# Patient Record
Sex: Female | Born: 2005 | Race: White | Hispanic: No | Marital: Single | Smoking: Never smoker
Health system: Southern US, Community
[De-identification: ages and names within clinical notes are randomized; demographics above are authoritative.]

## PROBLEM LIST (undated history)

## (undated) DIAGNOSIS — T7840XA Allergy, unspecified, initial encounter: Secondary | ICD-10-CM

## (undated) DIAGNOSIS — R630 Anorexia: Secondary | ICD-10-CM

## (undated) DIAGNOSIS — Z9889 Other specified postprocedural states: Secondary | ICD-10-CM

## (undated) DIAGNOSIS — Q211 Atrial septal defect: Secondary | ICD-10-CM

## (undated) DIAGNOSIS — R000 Tachycardia, unspecified: Secondary | ICD-10-CM

## (undated) DIAGNOSIS — T4145XA Adverse effect of unspecified anesthetic, initial encounter: Secondary | ICD-10-CM

## (undated) DIAGNOSIS — A7740 Ehrlichiosis, unspecified: Secondary | ICD-10-CM

## (undated) DIAGNOSIS — T8859XA Other complications of anesthesia, initial encounter: Secondary | ICD-10-CM

## (undated) DIAGNOSIS — J0301 Acute recurrent streptococcal tonsillitis: Secondary | ICD-10-CM

## (undated) DIAGNOSIS — R112 Nausea with vomiting, unspecified: Secondary | ICD-10-CM

## (undated) HISTORY — PX: CHOLECYSTECTOMY: SHX55

## (undated) HISTORY — DX: Ehrlichiosis, unspecified: A77.40

## (undated) HISTORY — DX: Atrial septal defect: Q21.1

## (undated) HISTORY — DX: Allergy, unspecified, initial encounter: T78.40XA

---
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
## 2023-01-21 DIAGNOSIS — N390 Urinary tract infection, site not specified: Secondary | ICD-10-CM | POA: Diagnosis not present

## 2023-01-23 DIAGNOSIS — L299 Pruritus, unspecified: Secondary | ICD-10-CM | POA: Diagnosis not present

## 2023-01-25 ENCOUNTER — Ambulatory Visit (INDEPENDENT_AMBULATORY_CARE_PROVIDER_SITE_OTHER): Payer: Medicaid Other | Admitting: Family

## 2023-01-25 ENCOUNTER — Encounter: Payer: Self-pay | Admitting: Family

## 2023-01-25 VITALS — BP 126/73 | HR 88 | Temp 98.5°F | Ht 72.0 in

## 2023-01-25 DIAGNOSIS — L299 Pruritus, unspecified: Secondary | ICD-10-CM | POA: Diagnosis not present

## 2023-01-25 MED ORDER — CETIRIZINE HCL 10 MG PO TABS
10.0000 mg | ORAL_TABLET | Freq: Every day | ORAL | 1 refills | Status: AC
Start: 2023-01-25 — End: ?

## 2023-01-25 NOTE — Progress Notes (Signed)
   Subjective:    Patient ID: Mary Russo, female    DOB: 30-Dec-2005, 17 y.o.   MRN: [id number]  Chief Complaint  Patient presents with   ear itching     Went to urgent care they told her it had scars    PT presents to the office today with left ear itching and pain that started last week. Went to the Urgent Care and told she had scaring in her ear.  Otalgia  There is pain in the left ear. This is a new problem. The current episode started in the past 7 days. There has been no fever. The pain is mild.      Review of Systems  HENT:  Positive for ear pain.   All other systems reviewed and are negative.      Objective:   Physical Exam Vitals reviewed.  Constitutional:      General: She is not in acute distress.    Appearance: She is well-developed.  HENT:     Head: Normocephalic and atraumatic.  Eyes:     Pupils: Pupils are equal, round, and reactive to light.  Neck:     Thyroid: No thyromegaly.  Cardiovascular:     Rate and Rhythm: Normal rate and regular rhythm.     Heart sounds: Normal heart sounds. No murmur heard. Pulmonary:     Effort: Pulmonary effort is normal. No respiratory distress.     Breath sounds: Normal breath sounds. No wheezing.  Abdominal:     General: Bowel sounds are normal. There is no distension.     Palpations: Abdomen is soft.     Tenderness: There is no abdominal tenderness.  Musculoskeletal:        General: No tenderness. Normal range of motion.     Cervical back: Normal range of motion and neck supple.  Skin:    General: Skin is warm and dry.  Neurological:     Mental Status: She is alert and oriented to person, place, and time.     Cranial Nerves: No cranial nerve deficit.     Deep Tendon Reflexes: Reflexes are normal and symmetric.  Psychiatric:        Behavior: Behavior normal.        Thought Content: Thought content normal.        Judgment: Judgment normal.          BP 126/73   Pulse 88   Temp 98.5 F (36.9 C) (Temporal)    Ht 6' (1.829 m)   SpO2 98%   Breastfeeding No   BMI 21.97 kg/m   Assessment & Plan:   GLADYSE DUREE comes in today with chief complaint of ear itching  (Went to urgent care they told her it had scars )   Diagnosis and orders addressed:  1. Ear itch TM normal  Canal not erythemas Start zyrtec  Keep clean and dry Follow up if symptoms worsen or do not improve  - cetirizine (ZYRTEC ALLERGY) 10 MG tablet; Take 1 tablet (10 mg total) by mouth daily.  Dispense: 90 tablet; Refill: 1  Jannifer Rodney, FNP

## 2023-03-24 IMAGING — US US PELVIS COMPLETE
1 series · 14 of 25 positions shown · non-contrast
Comparison: None.

CLINICAL DATA: Pelvic pain

EXAM:
TRANSABDOMINAL ULTRASOUND OF PELVIS
DOPPLER ULTRASOUND OF OVARIES
TECHNIQUE: Transabdominal ultrasound examination of the pelvis was performed
including evaluation of the uterus, ovaries, adnexal regions, and
pelvic cul-de-sac.
Color and duplex Doppler ultrasound was utilized to evaluate blood
flow to the ovaries.

[Series 1: us pelvis (transabdominal only) · 14 of 47 slices shown]
[im 1/47]
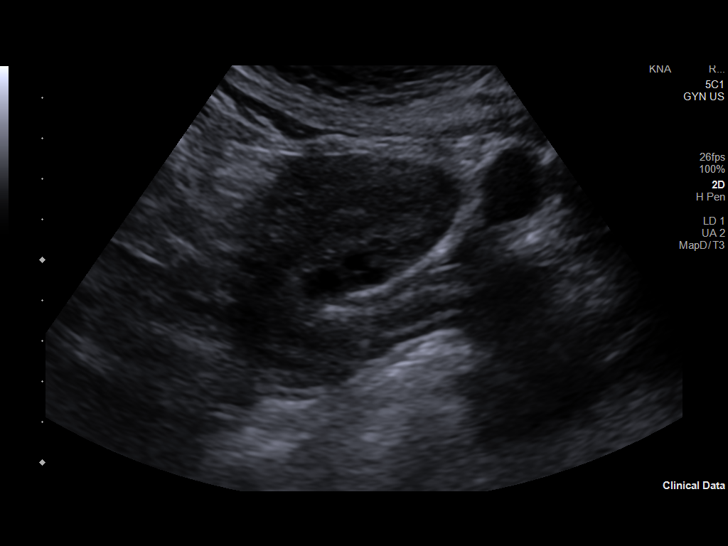
[im 4/47]
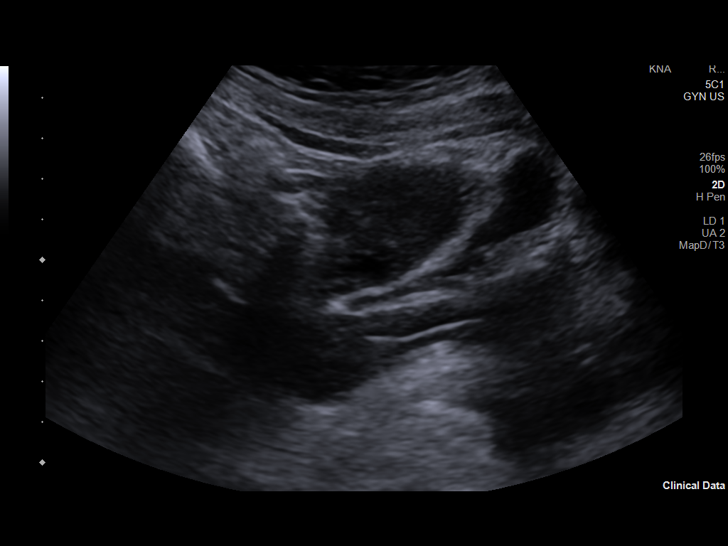
[im 8/47]
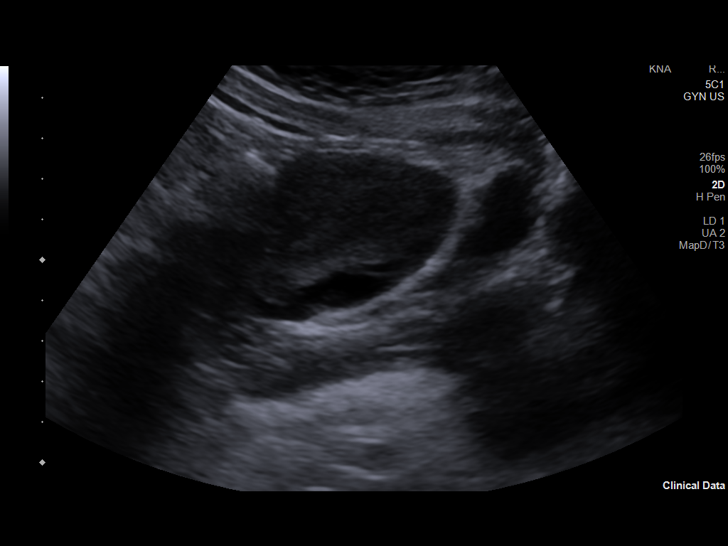
[im 12/47]
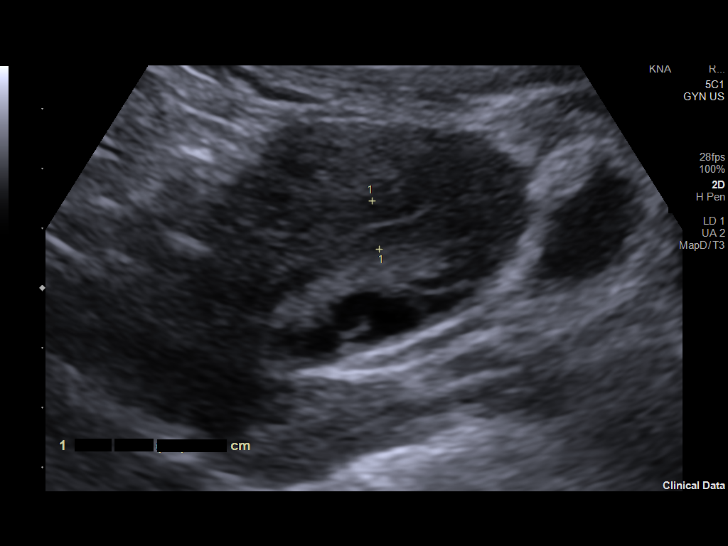
[im 16/47]
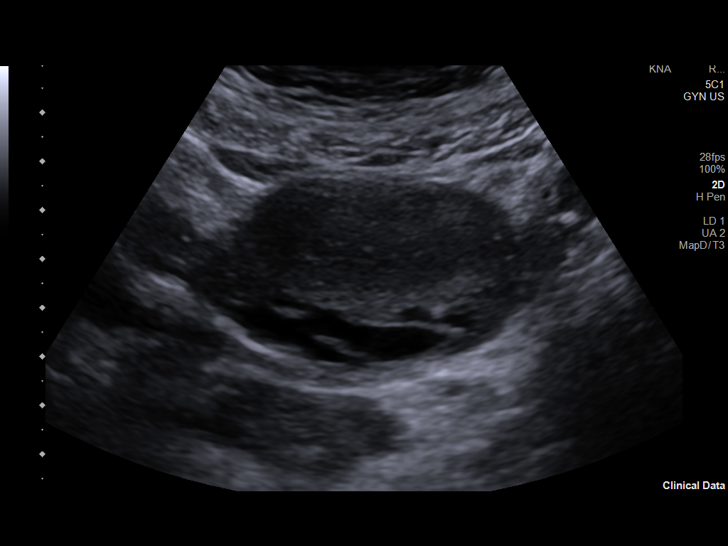
[im 18/47]
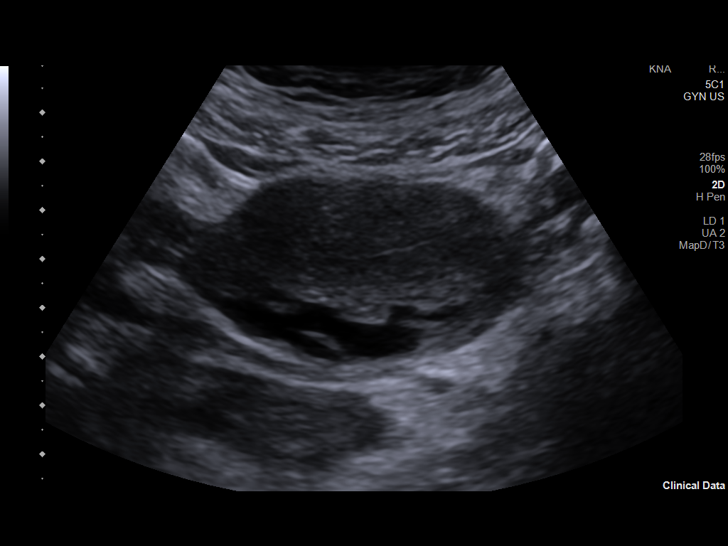
[im 22/47]
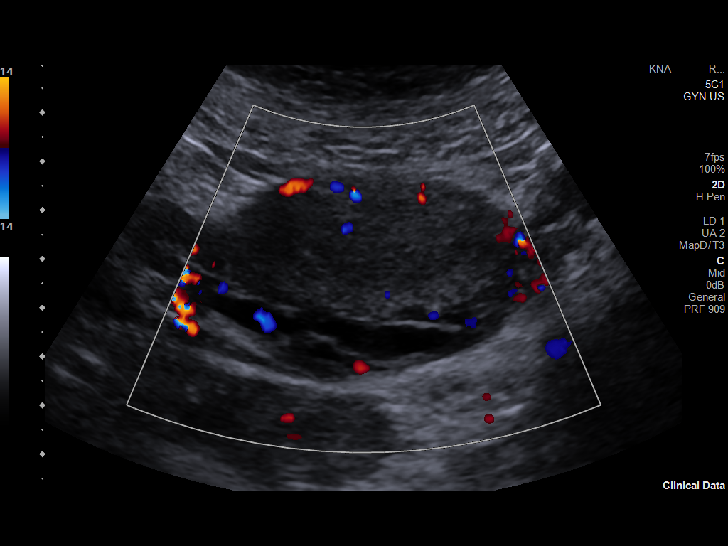
[im 25/47]
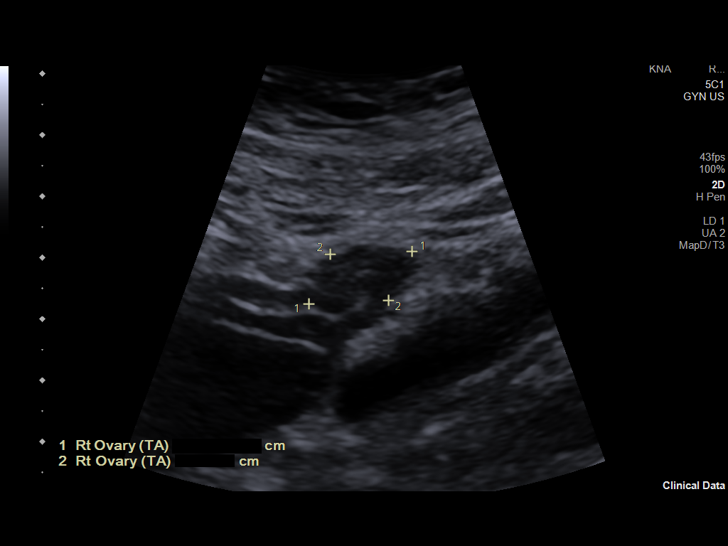
[im 29/47]
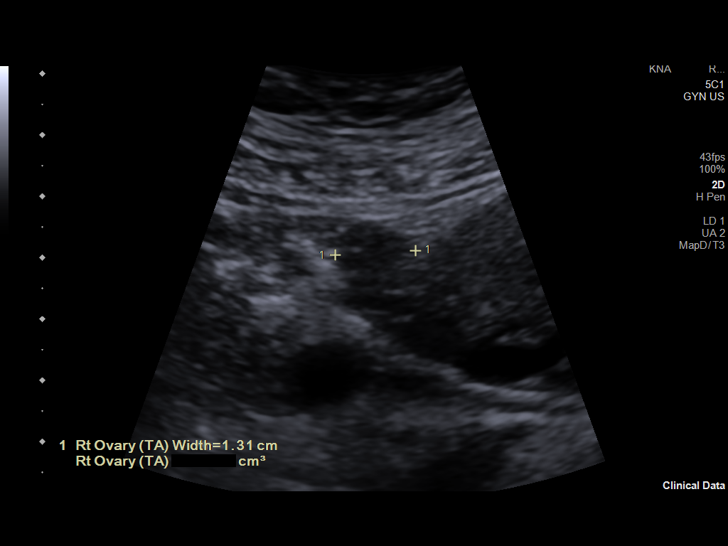
[im 31/47]
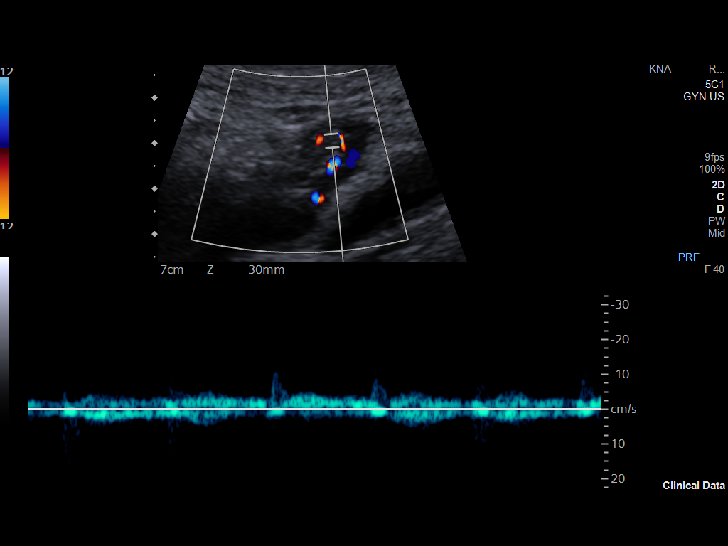
[im 35/47]
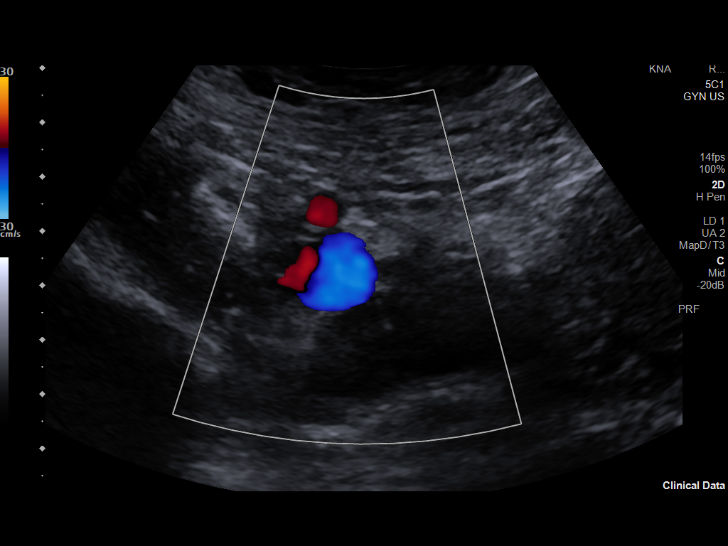
[im 39/47]
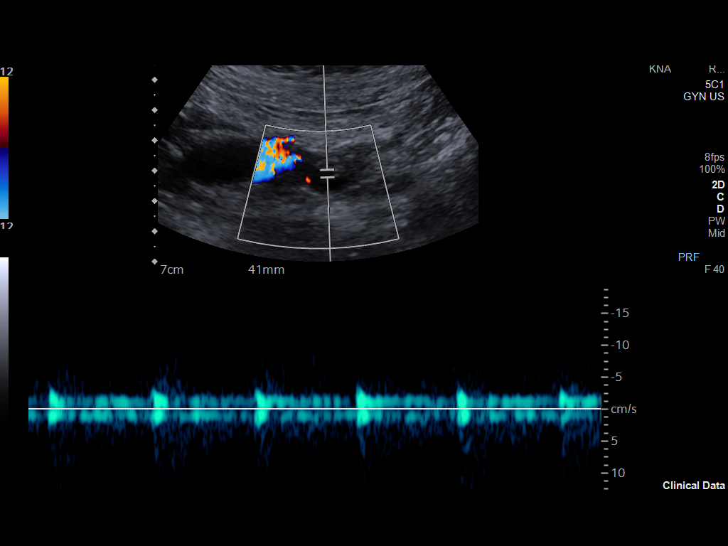
[im 43/47]
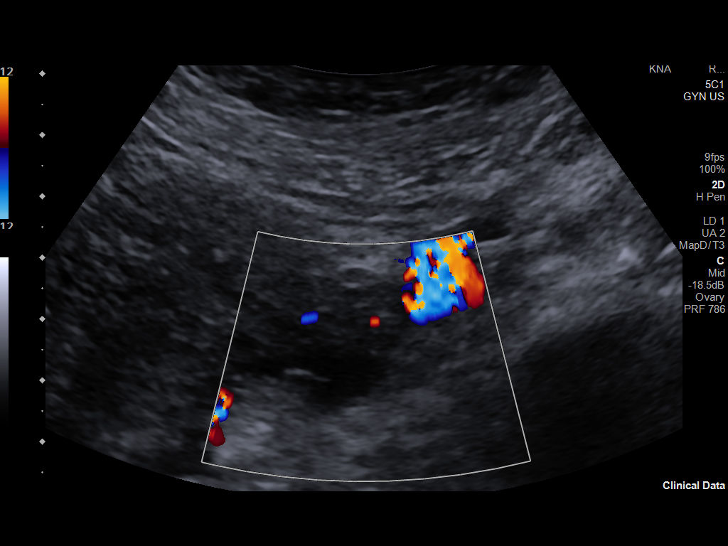
[im 47/47]
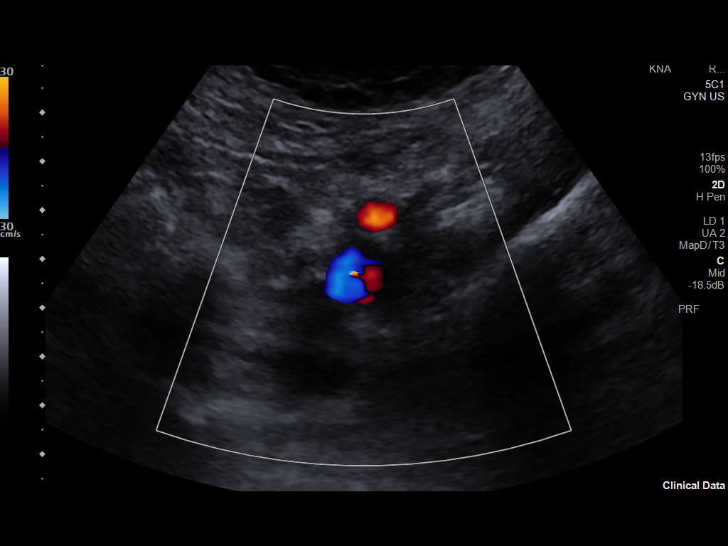

[14 of 25 positions shown; findings below may reference images not displayed]

FINDINGS: Uterus

Measurements: 4.8 x 4.2 x 6.1 cm = volume: 90 mL. No fibroids or
other mass visualized. Prominent myometrial veins noted.

Endometrium

Thickness: 8.  No focal abnormality visualized.

Right ovary

Measurements: 1.9 x 1.2 x 1.3 cm = volume: 1.6 mL. Normal
appearance/no adnexal mass.

Left ovary

Measurements: 2 x 1.5 x 1.9 cm = volume: 3.1 mL. Normal
appearance/no adnexal mass.

Pulsed Doppler evaluation demonstrates normal low-resistance
arterial and venous waveforms in both ovaries.

Other: None.
IMPRESSION: No acute process or suspicious mass identified in the pelvis.

## 2023-03-24 IMAGING — CT CT RENAL STONE PROTOCOL
2 of 4 series · 16 of 46 positions shown, 18 images · non-contrast
Comparison: CT Abdomen and Pelvis 02/16/2017.

CLINICAL DATA: 15-year-old female with right lower quadrant
abdominal pain progressive for 2 days. Recently diagnosed with UTI.

EXAM:
CT ABDOMEN AND PELVIS WITHOUT CONTRAST
TECHNIQUE: Multidetector CT imaging of the abdomen and pelvis was performed
following the standard protocol without IV contrast.

[Series 2: axial st · axial · 0.68mm/px · z∈[+658,+1058]mm · 13 of 89 slices shown, 15 images]
[im 5/89  soft-tissue]
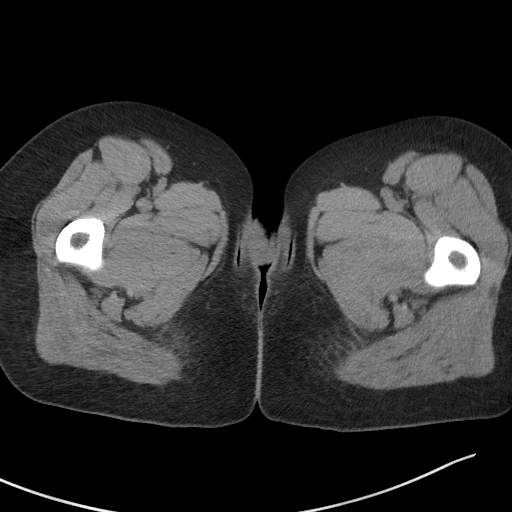
[im 5/89  bone]
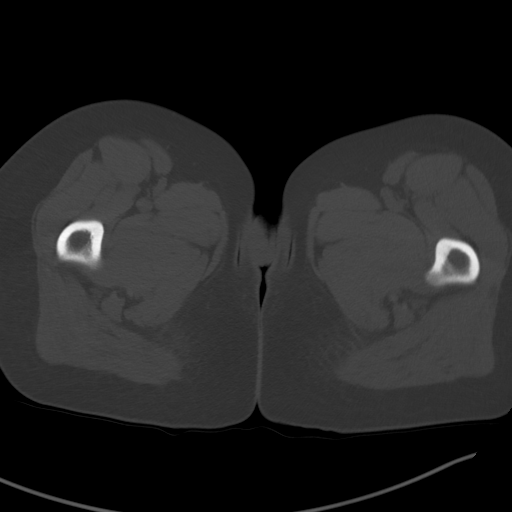
[im 13/89  soft-tissue]
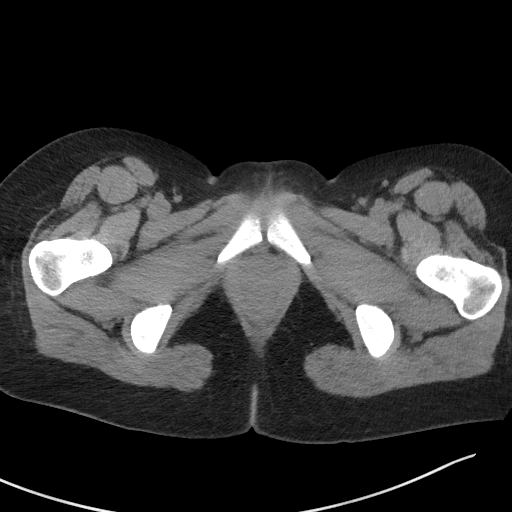
[im 21/89  soft-tissue]
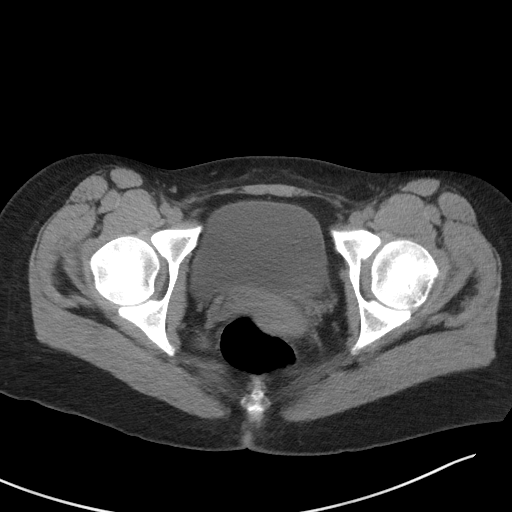
[im 25/89  soft-tissue]
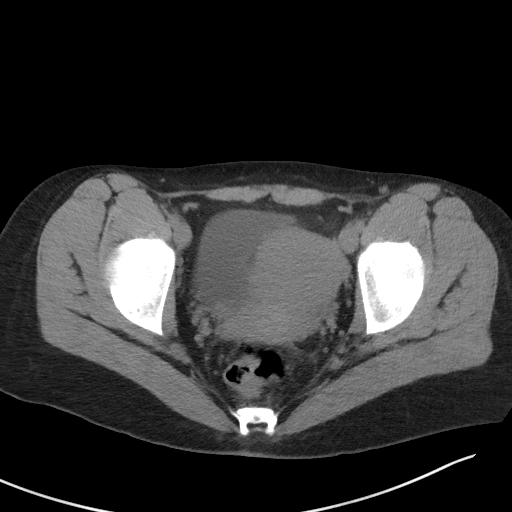
[im 33/89  soft-tissue]
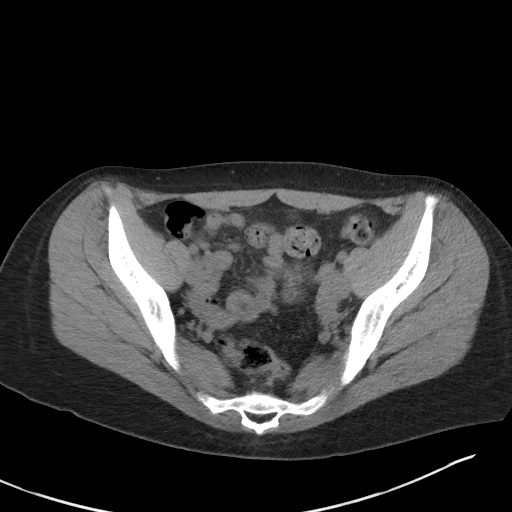
[im 37/89  soft-tissue]
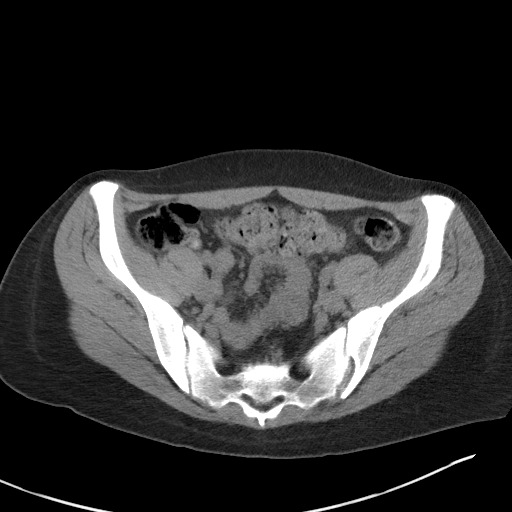
[im 45/89  soft-tissue]
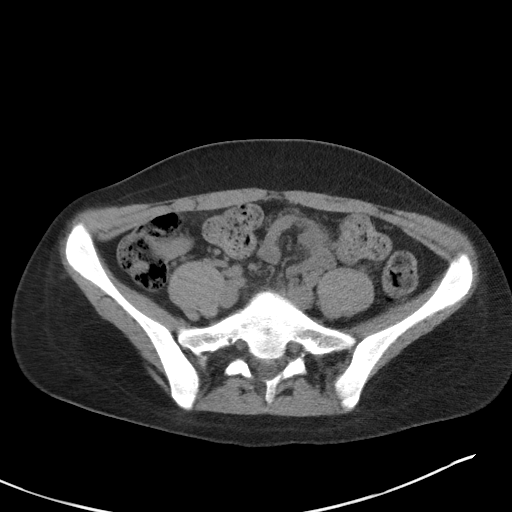
[im 53/89  soft-tissue]
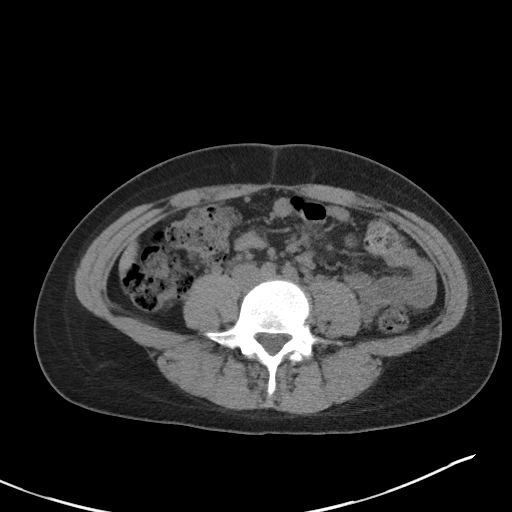
[im 57/89  soft-tissue]
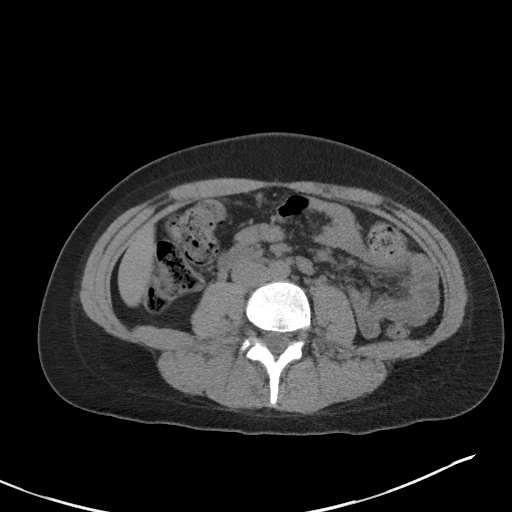
[im 57/89  bone]
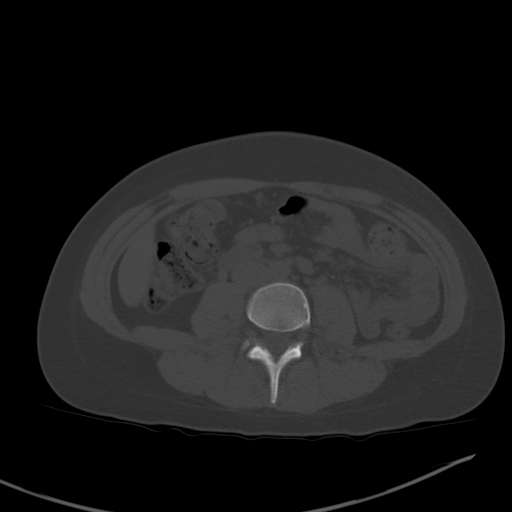
[im 65/89  soft-tissue]
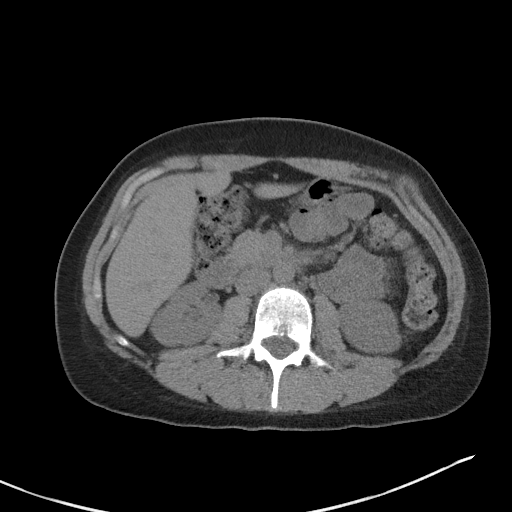
[im 69/89  soft-tissue]
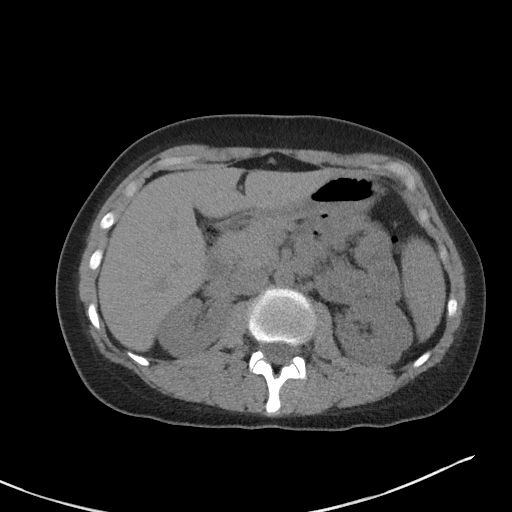
[im 77/89  soft-tissue]
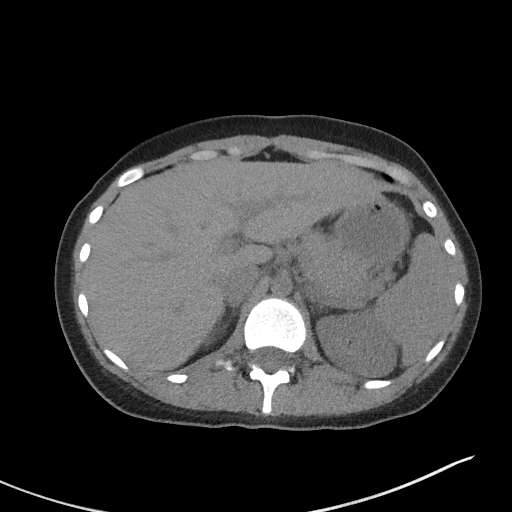
[im 85/89  soft-tissue]
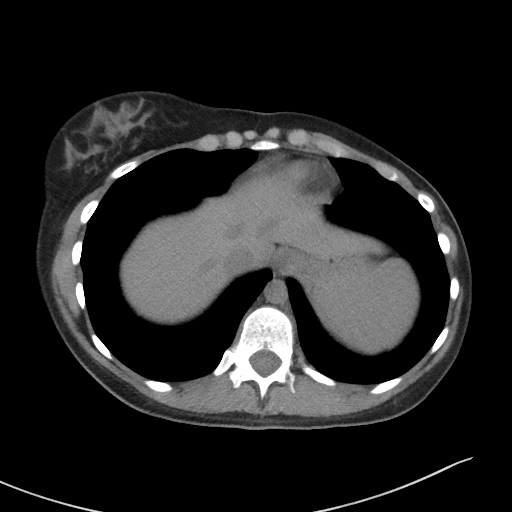

[Series 5: coronal st · coronal · 0.69mm/px · 3 of 90 slices shown]
[im 40/90  soft-tissue]
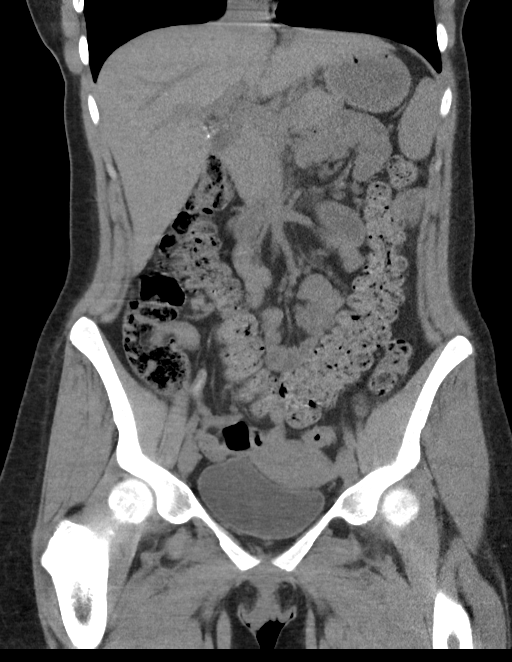
[im 50/90  soft-tissue]
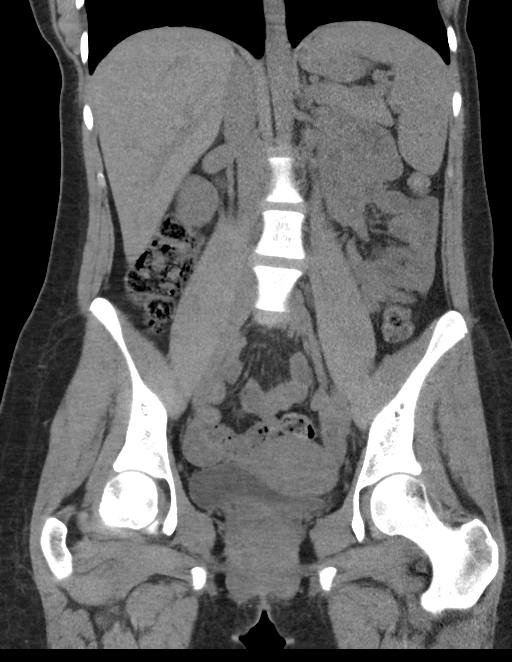
[im 60/90  soft-tissue]
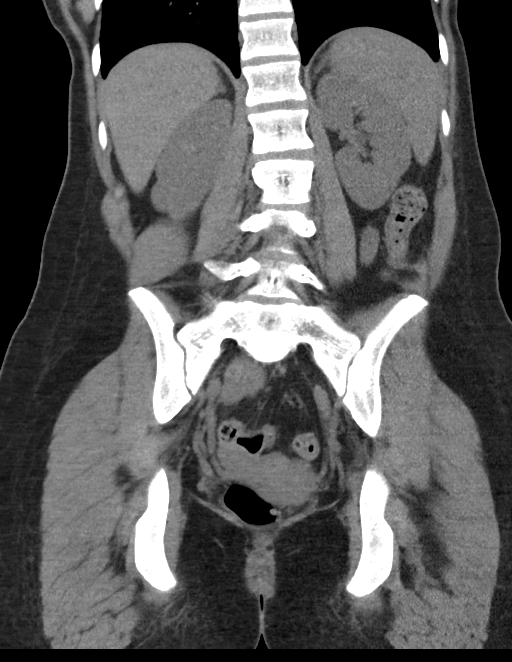

[16 of 46 positions shown; findings below may reference images not displayed]

FINDINGS: Lower chest: Negative.

Hepatobiliary: Chronically absent gallbladder. Negative noncontrast
liver.

Pancreas: Negative.

Spleen: Negative.

Adrenals/Urinary Tract: Normal adrenal glands. Noncontrast kidneys
appears symmetric and within normal limits. No nephrolithiasis. No
pararenal inflammation. Punctate bilateral pelvic phleboliths on
series 2, image 70, but no calcification identified along the course
of either ureter. Unremarkable bladder.

Stomach/Bowel: Fairly redundant large bowel with retained stool. No
pericecal inflammation.

The appendix is suboptimally visualized. Nondilated appendix is
suspected on series 2, images 57 and 58. Ileocecal valve is on
series 2, image 45. And the appendix probably arises from the cecum
on series 2, image 53. At the proximal appendix there is
hyperdensity within the lumen, and the structure measures up to 7 mm
(coronal image 40), slightly enlarged.

Negative terminal ileum and no dilated small bowel. Stomach and
duodenum appear negative. No free air, free fluid, or mesenteric
inflammation identified.

Vascular/Lymphatic: Normal caliber abdominal aorta. No calcified
atherosclerosis or lymphadenopathy identified.

Reproductive: Negative noncontrast appearance.

Other: No pelvic free fluid.

Musculoskeletal: Mild levoconvex lumbar scoliosis. Otherwise
negative.
IMPRESSION: 1. Questionable appendicolith, but no strong evidence of acute
appendicitis.
If the clinical course is equivocal then a repeat CT Abdomen and
Pelvis with both IV and oral contrast in 12-24 hours may be
valuable.

2. No urinary calculus or obstructive uropathy, and no other acute
or inflammatory process identified in the noncontrast abdomen or
pelvis.

## 2023-03-26 IMAGING — CT CT ABD-PELV W/ CM
2 of 4 series · 16 of 46 positions shown, 18 images · IV contrast (OMNIPAQUE)
Comparison: 04/16/2021

CLINICAL DATA: Right lower quadrant pain for 1 week

EXAM:
CT ABDOMEN AND PELVIS WITH CONTRAST
TECHNIQUE: Multidetector CT imaging of the abdomen and pelvis was performed
using the standard protocol following bolus administration of
intravenous contrast.
CONTRAST:  80mL OMNIPAQUE IOHEXOL 350 MG/ML SOLN

[Series 2: axial st · axial · 0.75mm/px · z∈[-747,-347]mm · 13 of 90 slices shown, 15 images]
[im 5/90  soft-tissue]
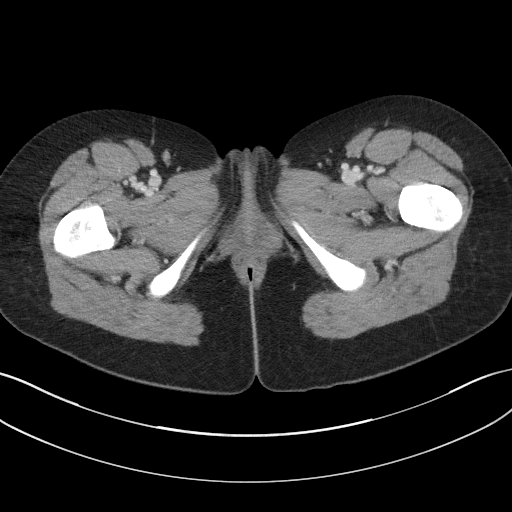
[im 5/90  bone]
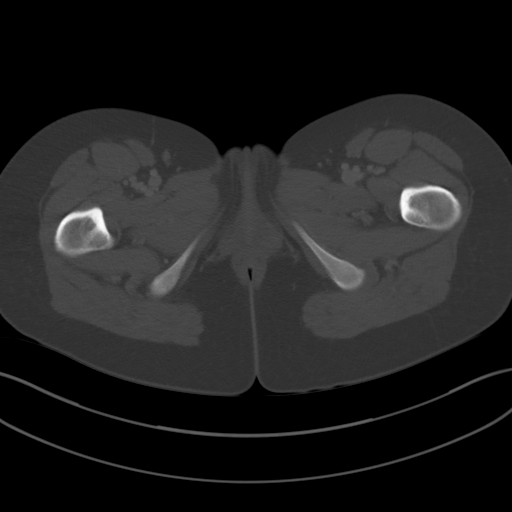
[im 13/90  soft-tissue]
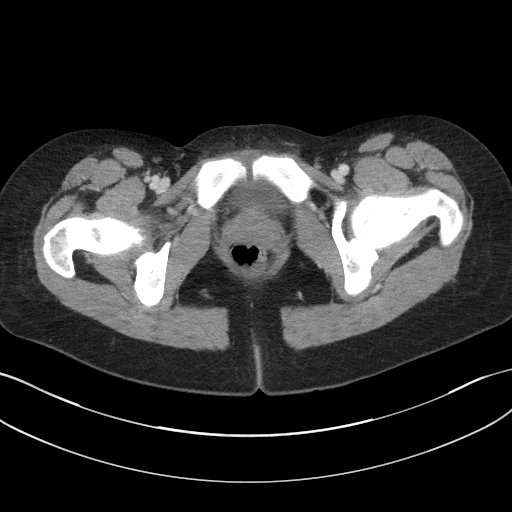
[im 21/90  soft-tissue]
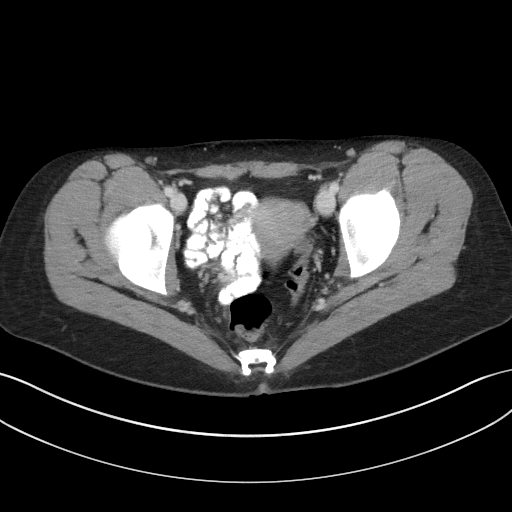
[im 25/90  soft-tissue]
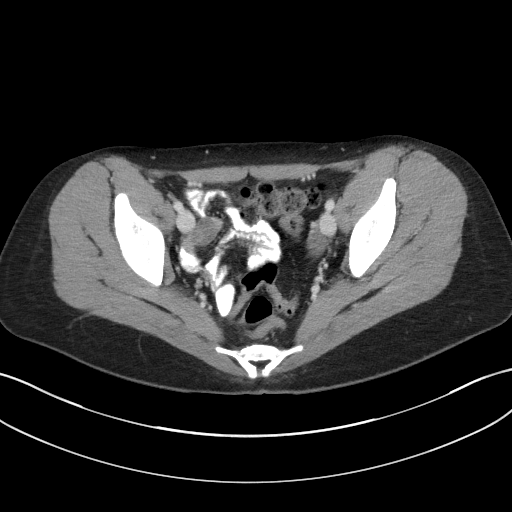
[im 33/90  soft-tissue]
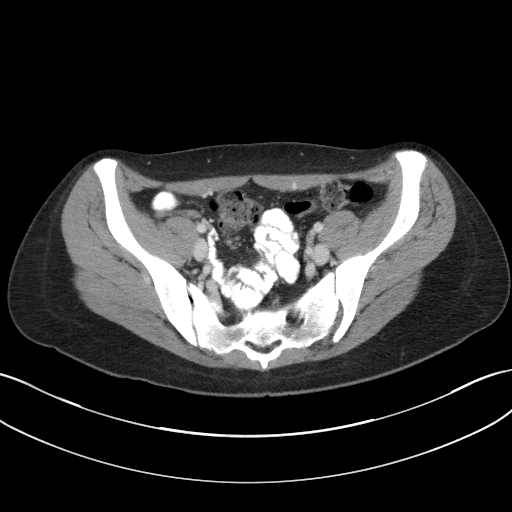
[im 37/90  soft-tissue]
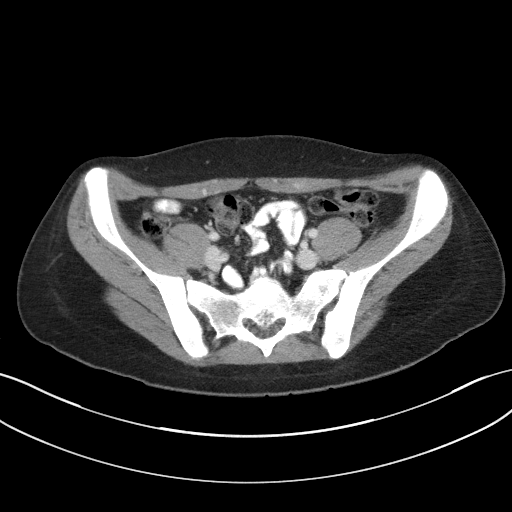
[im 45/90  soft-tissue]
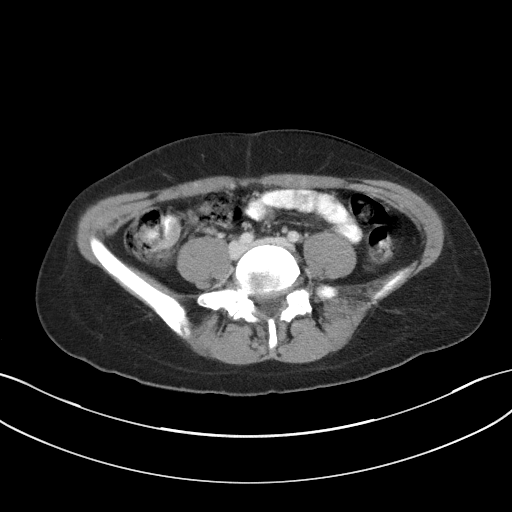
[im 53/90  soft-tissue]
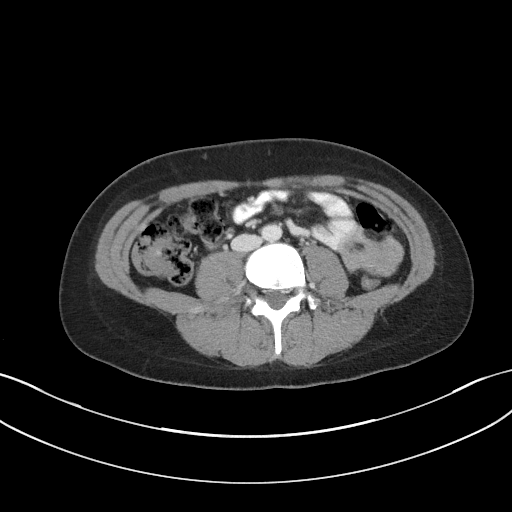
[im 57/90  soft-tissue]
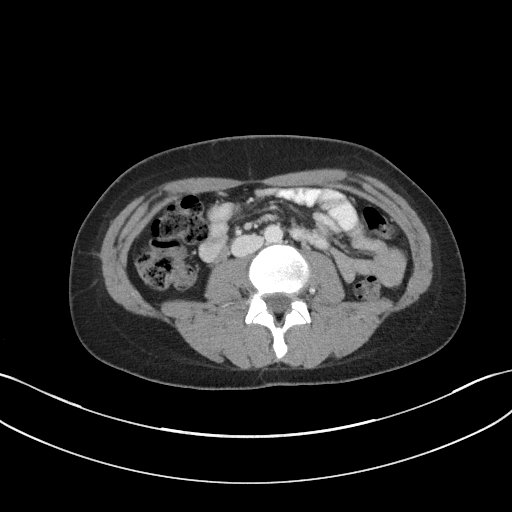
[im 57/90  bone]
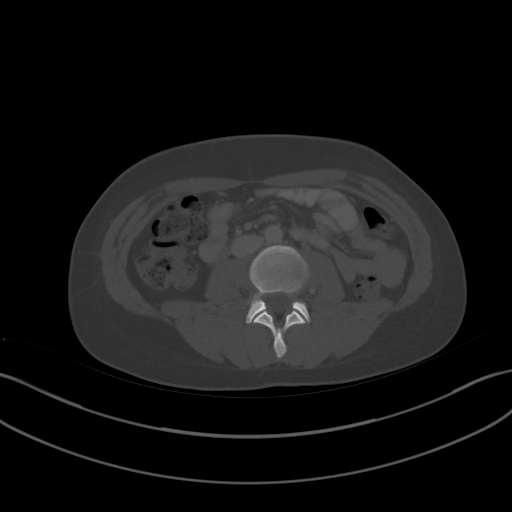
[im 65/90  soft-tissue]
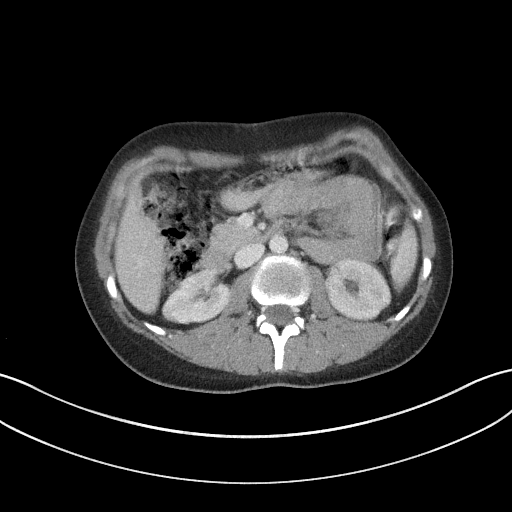
[im 69/90  soft-tissue]
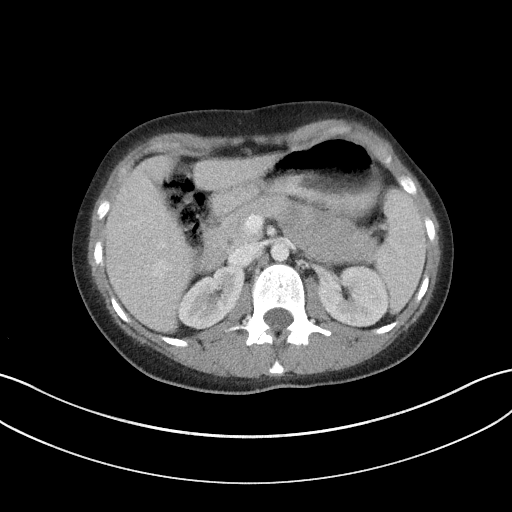
[im 77/90  soft-tissue]
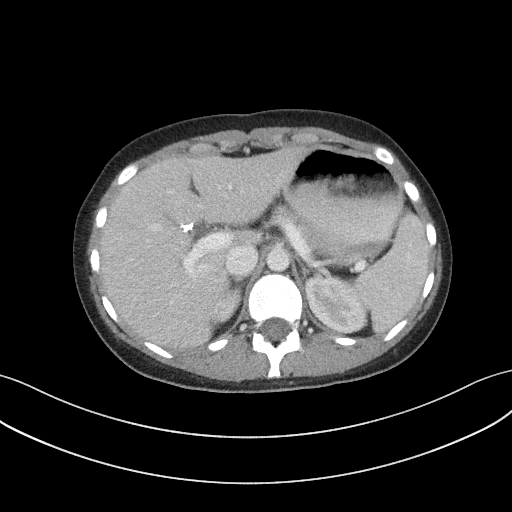
[im 85/90  soft-tissue]
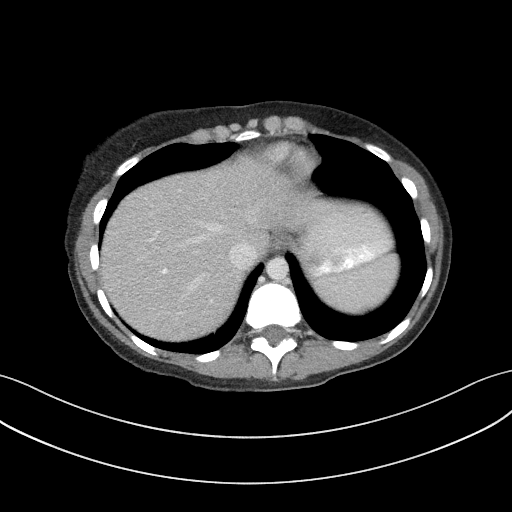

[Series 5: coronal st · coronal · 0.68mm/px · 3 of 100 slices shown]
[im 34/100  soft-tissue]
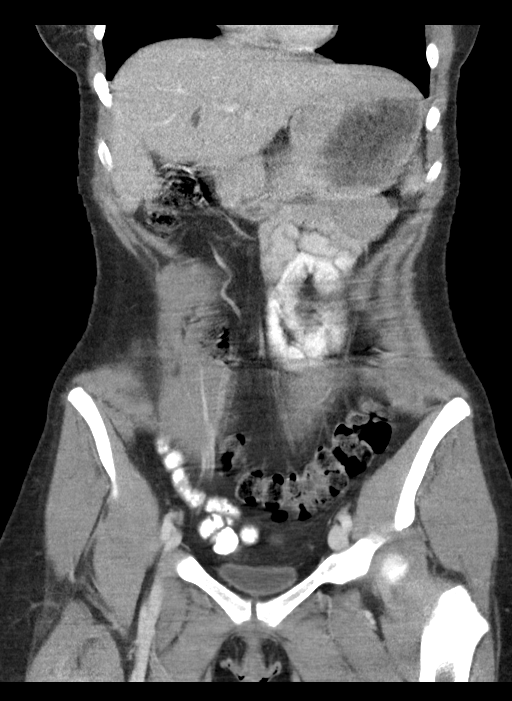
[im 45/100  soft-tissue]
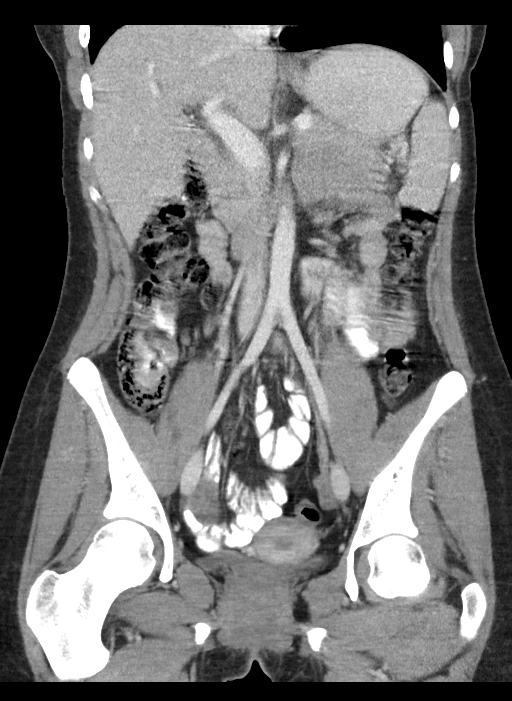
[im 56/100  soft-tissue]
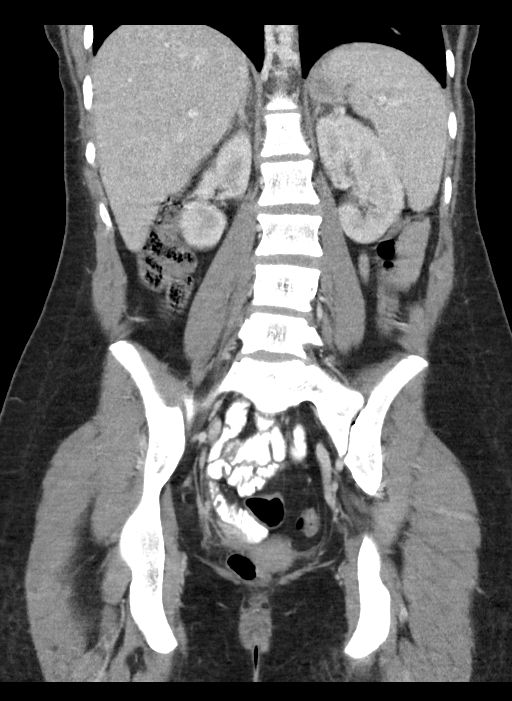

[16 of 46 positions shown; findings below may reference images not displayed]

FINDINGS: Lower chest: No acute pleural or parenchymal lung disease.

Hepatobiliary: Portions of the dome of the liver are excluded by
slice selection. The visualized portions of the liver are
unremarkable. Gallbladder is surgically absent.

Pancreas: Unremarkable. No pancreatic ductal dilatation or
surrounding inflammatory changes.

Spleen: Normal in size without focal abnormality.

Adrenals/Urinary Tract: Adrenal glands are unremarkable. Kidneys are
normal, without renal calculi, focal lesion, or hydronephrosis.
Bladder is decompressed which limits its evaluation.

Stomach/Bowel: No bowel obstruction or ileus. A normal appendix is
seen in the right lower quadrant. The high attenuation material
within the appendiceal lumen previously is no longer identified. No
appendicoliths on this exam. No bowel wall thickening or
inflammatory change.

Vascular/Lymphatic: No significant vascular findings are present.
Subcentimeter lymph nodes are seen within the right lower quadrant
mesentery, likely reactive. No enlarged abdominal or pelvic lymph
nodes.

Reproductive: Uterus and bilateral adnexa are unremarkable.

Other: No free fluid or free gas.  No abdominal wall hernia.

Musculoskeletal: No acute or destructive bony lesions. Reconstructed
images demonstrate no additional findings.
IMPRESSION: 1. Normal appendix.
2. Subcentimeter right lower quadrant mesenteric lymph nodes, which
could reflect mesenteric adenitis in a patient with right lower
quadrant pain.

## 2023-04-03 ENCOUNTER — Ambulatory Visit: Payer: 59 | Admitting: Family Medicine

## 2023-04-03 ENCOUNTER — Encounter: Payer: Self-pay | Admitting: Family Medicine

## 2023-04-03 VITALS — BP 109/72 | HR 72 | Temp 98.7°F | Ht 72.0 in | Wt 161.0 lb

## 2023-04-03 DIAGNOSIS — W57XXXA Bitten or stung by nonvenomous insect and other nonvenomous arthropods, initial encounter: Secondary | ICD-10-CM | POA: Diagnosis not present

## 2023-04-03 DIAGNOSIS — S80862A Insect bite (nonvenomous), left lower leg, initial encounter: Secondary | ICD-10-CM

## 2023-04-03 MED ORDER — DOXYCYCLINE HYCLATE 100 MG PO TABS
100.0000 mg | ORAL_TABLET | Freq: Two times a day (BID) | ORAL | 0 refills | Status: AC
Start: 2023-04-03 — End: 2023-04-13

## 2023-04-03 NOTE — Progress Notes (Signed)
Subjective:  Patient ID: Mary Russo, female    DOB: March 09, 2006, 17 y.o.   MRN: [id number]  Patient Care Team: [name], Elige Radon, MD as PCP - General (Family Medicine) Thomasene Ripple, DO as PCP - Cardiology (Cardiology)   Chief Complaint:  Insect Bite (Possible spider bite?)  HPI: Mary Russo is a 17 y.o. female presenting on 04/03/2023 for Insect Bite (Possible spider bite?) First noticed this morning to aching. Denies any redness at the area. She went into work and noticed itching at 10 am and felt spot. She noticed redness and pain at the area. Reports that it feels like a bruise with soreness and cramping. Endorses jitteriness. Does not know of a specific bite. States that she was outdoors this weekend.   Relevant past medical, surgical, family, and social history reviewed and updated as indicated.  Allergies and medications reviewed and updated. Data reviewed: Chart in Epic.   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   Tachycardia, unspecified     Past Surgical History:  Procedure Laterality Date   ANKLE RECONSTRUCTION Right 01/30/2019   Procedure: Right ankle lateral ligament reconstruction;  Surgeon: Toni Arthurs, MD;  Location: [facility];  Service: Orthopedics;  Laterality: Right;   CHOLECYSTECTOMY      Social History   Socioeconomic History   Marital status: Single    Spouse name: Not on file   Number of children: Not on file   Years of education: Not on file   Highest education level: Not on file  Occupational History   Not on file  Tobacco Use   Smoking status: Never   Smokeless tobacco: Never  Vaping Use   Vaping status: Never Used  Substance and Sexual Activity    Alcohol use: No   Drug use: No   Sexual activity: Yes  Other Topics Concern   Not on file  Social History Narrative   Not on file   Social Determinants of Health   Financial Resource Strain: Not on file  Food Insecurity: No Food Insecurity (07/07/2021)   Hunger Vital Sign    Worried About Running Out of Food in the Last Year: Never true    Ran Out of Food in the Last Year: Never true  Transportation Needs: No Transportation Needs (07/07/2021)   PRAPARE - Administrator, Civil Service (Medical): No    Lack of Transportation (Non-Medical): No  Physical Activity: Not on file  Stress: Not on file  Social Connections: Not on file  Intimate Partner Violence: Not on file    Outpatient Encounter Medications as of 04/03/2023  Medication Sig   cetirizine (ZYRTEC ALLERGY) 10 MG tablet Take 1 tablet (10 mg total) by mouth daily.   citalopram (CELEXA) 20 MG tablet Take 20 mg by mouth daily.   ibuprofen (ADVIL) 600 MG tablet Take 1 tablet (600 mg total) by mouth every 6 (six) hours as needed.   metoprolol succinate (TOPROL-XL) 25 MG 24 hr tablet Take 1 tablet (25 mg total) by mouth daily.   norethindrone-ethinyl estradiol-FE (LOESTRIN FE) 1-20 MG-MCG tablet Take 1 tablet by mouth daily.   No facility-administered encounter medications on  file as of 04/03/2023.    Allergies  Allergen Reactions   Peanut-Containing Drug Products Rash    Peanuts, Peanut  butter   Review of Systems As per HPI  Objective:  BP 109/72   Pulse 72   Temp 98.7 F (37.1 C)   Ht 6' (1.829 m)   Wt 161 lb (73 kg)   LMP 04/03/2023 (Exact Date)   SpO2 99%   BMI 21.84 kg/m    Wt Readings from Last 3 Encounters:  04/03/23 161 lb (73 kg) (88%, Z= 1.16)*  11/02/22 162 lb (73.5 kg) (89%, Z= 1.21)*  10/06/22 161 lb (73 kg) (88%, Z= 1.19)*   * Growth percentiles are based on CDC (Girls, 2-20 Years) data.   Physical Exam Constitutional:      General: She is awake. She is not in acute distress.     Appearance: Normal appearance. She is well-developed and well-groomed. She is not ill-appearing, toxic-appearing or diaphoretic.  Cardiovascular:     Rate and Rhythm: Normal rate and regular rhythm.     Pulses: Normal pulses.          Radial pulses are 2+ on the right side and 2+ on the left side.       Posterior tibial pulses are 2+ on the right side and 2+ on the left side.     Heart sounds: Normal heart sounds. No murmur heard.    No gallop.  Pulmonary:     Effort: Pulmonary effort is normal. No respiratory distress.     Breath sounds: Normal breath sounds. No stridor. No wheezing, rhonchi or rales.  Musculoskeletal:     Cervical back: Full passive range of motion without pain and neck supple.     Right lower leg: No edema.     Left lower leg: No edema.  Skin:    General: Skin is warm.     Capillary Refill: Capillary refill takes less than 2 seconds.     Coloration: Skin is pale.          Comments: Flat, erythematous circular lesion with central circular hyperpigmentation on left lower calf with surrounding area of tenderness to palpation.  34 cm circumference of Right calf  36 cm circumference of left calf   Neurological:     General: No focal deficit present.     Mental Status: She is alert, oriented to person, place, and time and easily aroused. Mental status is at baseline.     GCS: GCS eye subscore is 4. GCS verbal subscore is 5. GCS motor subscore is 6.     Motor: No weakness.  Psychiatric:        Attention and Perception: Attention and perception normal.        Mood and Affect: Mood and affect normal.        Speech: Speech normal.        Behavior: Behavior normal. Behavior is cooperative.        Thought Content: Thought content normal. Thought content does not include homicidal or suicidal ideation. Thought content does not include homicidal or suicidal plan.        Cognition and Memory: Cognition and memory normal.        Judgment: Judgment normal.    Results for  orders placed or performed in visit on 11/02/22  Anemia Profile B  Result Value Ref Range   Total Iron Binding Capacity 363 250 - 450 ug/dL   UIBC 478 295 - 621 ug/dL   Iron 68  27 - 159 ug/dL   Iron Saturation 19 15 - 55 %   Ferritin 11 (L) 15 - 77 ng/mL   Vitamin B-12 268 232 - 1,245 pg/mL   Folate 6.0 >3.0 ng/mL   WBC 6.1 3.4 - 10.8 x10E3/uL   RBC 4.32 3.77 - 5.28 x10E6/uL   Hemoglobin 11.8 11.1 - 15.9 g/dL   Hematocrit 16.1 09.6 - 46.6 %   MCV 85 79 - 97 fL   MCH 27.3 26.6 - 33.0 pg   MCHC 32.3 31.5 - 35.7 g/dL   RDW 04.5 40.9 - 81.1 %   Platelets 307 150 - 450 x10E3/uL   Neutrophils 59 Not Estab. %   Lymphs 30 Not Estab. %   Monocytes 7 Not Estab. %   Eos 3 Not Estab. %   Basos 1 Not Estab. %   Neutrophils Absolute 3.6 1.4 - 7.0 x10E3/uL   Lymphocytes Absolute 1.8 0.7 - 3.1 x10E3/uL   Monocytes Absolute 0.4 0.1 - 0.9 x10E3/uL   EOS (ABSOLUTE) 0.2 0.0 - 0.4 x10E3/uL   Basophils Absolute 0.1 0.0 - 0.2 x10E3/uL   Immature Granulocytes 0 Not Estab. %   Immature Grans (Abs) 0.0 0.0 - 0.1 x10E3/uL   Retic Ct Pct 1.0 0.6 - 2.6 %  TSH  Result Value Ref Range   TSH 3.330 0.450 - 4.500 uIU/mL  CMP14+EGFR  Result Value Ref Range   Glucose 85 70 - 99 mg/dL   BUN 8 6 - 20 mg/dL   Creatinine, Ser 9.14 0.57 - 1.00 mg/dL   eGFR 782 >95 AO/ZHY/8.65   BUN/Creatinine Ratio 10 9 - 23   Sodium 138 134 - 144 mmol/L   Potassium 4.1 3.5 - 5.2 mmol/L   Chloride 104 96 - 106 mmol/L   CO2 20 20 - 29 mmol/L   Calcium 9.8 8.7 - 10.2 mg/dL   Total Protein 7.0 6.0 - 8.5 g/dL   Albumin 4.5 4.0 - 5.0 g/dL   Globulin, Total 2.5 1.5 - 4.5 g/dL   Albumin/Globulin Ratio 1.8 1.2 - 2.2   Bilirubin Total 0.3 0.0 - 1.2 mg/dL   Alkaline Phosphatase 81 42 - 106 IU/L   AST 12 0 - 40 IU/L   ALT 12 0 - 32 IU/L  VITAMIN D 25 Hydroxy (Vit-D Deficiency, Fractures)  Result Value Ref Range   Vit D, 25-Hydroxy 33.2 30.0 - 100.0 ng/mL      11/02/2022    2:35 PM 10/06/2022    9:51 AM 10/06/2022    9:50 AM  01/27/2022    2:43 PM 07/05/2021   10:00 AM  Depression screen PHQ 2/9  Decreased Interest 0  0 0 0  Down, Depressed, Hopeless 0  0 0 0  PHQ - 2 Score 0  0 0 0  Altered sleeping 0 0  0   Tired, decreased energy 0 0  0   Change in appetite 0 0  0   Feeling bad or failure about yourself  0 0  0   Trouble concentrating 0 0  0   Moving slowly or fidgety/restless 0 0  0   Suicidal thoughts 0 0  0   PHQ-9 Score 0   0   Difficult doing work/chores Not difficult at all Not difficult at all  Not difficult at all        10/06/2022    9:50 AM 01/27/2022    2:43 PM 10/10/2019   11:01 AM 12/24/2018    9:03 AM  GAD 7 :  Generalized Anxiety Score  Nervous, Anxious, on Edge 0 0 0 2  Control/stop worrying 0 0 0 1  Worry too much - different things 0 0 0 3  Trouble relaxing 0 0 0 1  Restless 0 0 0 0  Easily annoyed or irritable 0 0 0 1  Afraid - awful might happen 0 0 0 0  Total GAD 7 Score 0 0 0 8  Anxiety Difficulty Not difficult at all Not difficult at all     Pertinent labs & imaging results that were available during my care of the patient were reviewed by me and considered in my medical decision making.  Assessment & Plan:  Mary Russo was seen today for insect bite.  Diagnoses and all orders for this visit:  Insect bite of left lower leg, initial encounter Will start medication as below. Discussed with patient using OTC topicals and benadryl to assist with itching.  -     doxycycline (VIBRA-TABS) 100 MG tablet; Take 1 tablet (100 mg total) by mouth 2 (two) times daily for 10 days.  Continue all other maintenance medications.  Follow up plan: Return if symptoms worsen or fail to improve.  Continue healthy lifestyle choices, including diet (rich in fruits, vegetables, and lean proteins, and low in salt and simple carbohydrates) and exercise (at least 30 minutes of moderate physical activity daily).  Written and verbal instructions provided   The above assessment and management plan was  discussed with the patient. The patient verbalized understanding of and has agreed to the management plan. Patient is aware to call the clinic if they develop any new symptoms or if symptoms persist or worsen. Patient is aware when to return to the clinic for a follow-up visit. Patient educated on when it is appropriate to go to the emergency department.   Neale Burly, DNP-FNP [facility] [address] [place], [place] [address] [phone]

## 2023-04-04 DIAGNOSIS — N390 Urinary tract infection, site not specified: Secondary | ICD-10-CM | POA: Diagnosis not present

## 2023-04-04 DIAGNOSIS — E876 Hypokalemia: Secondary | ICD-10-CM | POA: Diagnosis not present

## 2023-04-04 DIAGNOSIS — N200 Calculus of kidney: Secondary | ICD-10-CM | POA: Diagnosis not present

## 2023-04-11 DIAGNOSIS — N760 Acute vaginitis: Secondary | ICD-10-CM | POA: Diagnosis not present

## 2023-04-11 DIAGNOSIS — Z113 Encounter for screening for infections with a predominantly sexual mode of transmission: Secondary | ICD-10-CM | POA: Diagnosis not present

## 2023-04-11 DIAGNOSIS — Z01411 Encounter for gynecological examination (general) (routine) with abnormal findings: Secondary | ICD-10-CM | POA: Diagnosis not present

## 2023-05-02 DIAGNOSIS — J029 Acute pharyngitis, unspecified: Secondary | ICD-10-CM | POA: Diagnosis not present

## 2023-05-02 DIAGNOSIS — R519 Headache, unspecified: Secondary | ICD-10-CM | POA: Diagnosis not present

## 2023-05-02 DIAGNOSIS — R051 Acute cough: Secondary | ICD-10-CM | POA: Diagnosis not present

## 2023-07-11 DIAGNOSIS — J101 Influenza due to other identified influenza virus with other respiratory manifestations: Secondary | ICD-10-CM | POA: Diagnosis not present

## 2023-07-11 DIAGNOSIS — R059 Cough, unspecified: Secondary | ICD-10-CM | POA: Diagnosis not present

## 2023-07-11 DIAGNOSIS — Z20822 Contact with and (suspected) exposure to covid-19: Secondary | ICD-10-CM | POA: Diagnosis not present

## 2023-07-11 DIAGNOSIS — R110 Nausea: Secondary | ICD-10-CM | POA: Diagnosis not present

## 2023-07-31 ENCOUNTER — Encounter: Payer: Self-pay | Admitting: Family Medicine

## 2023-07-31 ENCOUNTER — Telehealth: Admitting: Family Medicine

## 2023-07-31 DIAGNOSIS — H5789 Other specified disorders of eye and adnexa: Secondary | ICD-10-CM

## 2023-07-31 MED ORDER — OLOPATADINE HCL 0.2 % OP SOLN
1.0000 [drp] | Freq: Every day | OPHTHALMIC | 0 refills | Status: DC
Start: 2023-07-31 — End: 2023-08-16

## 2023-07-31 NOTE — Progress Notes (Signed)
 Virtual Visit via Video Note  I connected with Mary Russo on 07/31/23 at  2:35 PM EST by a video enabled telemedicine application and verified that I am speaking with the correct person using two identifiers.  Patient Location: Other:  work in [employer] Location: Office/Clinic  I discussed the limitations, risks, security, and privacy concerns of performing an evaluation and management service by video and the availability of in person appointments. I also discussed with the patient that there may be a patient responsible charge related to this service. The patient expressed understanding and agreed to proceed.  Subjective: PCP: [name], Elige Radon, MD  Chief Complaint  Patient presents with   Eye Problem   Eye Problem    States that one week ago felt that she had eyelash in her eye. Completed rinse, continued to have some irritation. Started eye drops a few days ago and symptoms improved. Symptoms returned once she stopped drops. However, drops were irritating as well. She is unsure of the name of them. States that it was a "cooling" drop that was to help with redness and irritation. States that in the morning she has some crusting, itching, irritation, feels that she is constantly scratching her eye. States that it is just her left eye. Denies pain. No cyst. Does not use contact lenses.   ROS: Per HPI  Current Outpatient Medications:    cetirizine (ZYRTEC ALLERGY) 10 MG tablet, Take 1 tablet (10 mg total) by mouth daily., Disp: 90 tablet, Rfl: 1   citalopram (CELEXA) 20 MG tablet, Take 20 mg by mouth daily., Disp: , Rfl:    ibuprofen (ADVIL) 600 MG tablet, Take 1 tablet (600 mg total) by mouth every 6 (six) hours as needed., Disp: 90 tablet, Rfl: 0   metoprolol succinate (TOPROL-XL) 25 MG 24 hr tablet, Take 1 tablet (25 mg total) by mouth daily., Disp: 90 tablet, Rfl: 3   norethindrone-ethinyl estradiol-FE (LOESTRIN FE) 1-20 MG-MCG tablet, Take 1 tablet by mouth daily., Disp:  , Rfl:   Observations/Objective: There were no vitals filed for this visit. Physical Exam Constitutional:      General: She is awake. She is not in acute distress.    Appearance: Normal appearance. She is well-developed and well-groomed. She is not ill-appearing, toxic-appearing or diaphoretic.  Eyes:     General:        Left eye: No foreign body, discharge or hordeolum.     Extraocular Movements:     Right eye: Normal extraocular motion.     Left eye: Normal extraocular motion.     Conjunctiva/sclera:     Right eye: Right conjunctiva is not injected. No chemosis, exudate or hemorrhage.    Left eye: Left conjunctiva is not injected. No chemosis, exudate or hemorrhage. Pulmonary:     Effort: Pulmonary effort is normal.  Neurological:     General: No focal deficit present.     Mental Status: She is alert and oriented to person, place, and time.  Psychiatric:        Attention and Perception: Attention and perception normal.        Mood and Affect: Mood and affect normal.        Speech: Speech normal.        Behavior: Behavior normal. Behavior is cooperative.        Thought Content: Thought content normal.        Cognition and Memory: Cognition and memory normal.        Judgment:  Judgment normal.    Assessment and Plan: 1. Irritation of left eye (Primary) Will start medication as below. Encouraged patient to start zyrtec or xyzal as well. Discussed red flag symptoms.  - Olopatadine HCl 0.2 % SOLN; Apply 1 drop to eye daily.  Dispense: 2.5 mL; Refill: 0   Appt time 07/31/2023 02:35 PM Call duration: 00:05:39  Follow Up Instructions: Return if symptoms worsen or fail to improve.   I discussed the assessment and treatment plan with the patient. The patient was provided an opportunity to ask questions, and all were answered. The patient agreed with the plan and demonstrated an understanding of the instructions.   The patient was advised to call back or seek an in-person  evaluation if the symptoms worsen or if the condition fails to improve as anticipated.  The above assessment and management plan was discussed with the patient. The patient verbalized understanding of and has agreed to the management plan.   Neale Burly, DNP-FNP [facility] [address] [place], [place] [address] [phone]

## 2023-07-31 NOTE — Patient Instructions (Signed)
Olopatadine

## 2023-08-02 ENCOUNTER — Ambulatory Visit (INDEPENDENT_AMBULATORY_CARE_PROVIDER_SITE_OTHER): Admitting: Family Medicine

## 2023-08-02 VITALS — BP 120/69 | HR 98 | Temp 97.2°F | Ht 72.0 in

## 2023-08-02 DIAGNOSIS — H5789 Other specified disorders of eye and adnexa: Secondary | ICD-10-CM

## 2023-08-02 DIAGNOSIS — R300 Dysuria: Secondary | ICD-10-CM | POA: Diagnosis not present

## 2023-08-02 MED ORDER — NEOMYCIN-POLYMYXIN-HC 3.5-10000-1 OP SUSP: 3.0000 [drp] | Freq: Four times a day (QID) | OPHTHALMIC | 0 refills | Status: DC

## 2023-08-02 NOTE — Progress Notes (Signed)
 BP 120/69   Pulse 98   Temp (!) 97.2 F (36.2 C)   Ht 6' (1.829 m)   SpO2 100%   BMI 21.84 kg/m    Subjective:   Patient ID: Mary Russo, female    DOB: 10/21/2005, 18 y.o.   MRN: [id number]  HPI: Mary Russo is a 18 y.o. female presenting on 08/02/2023 for Eye Problem (Left eye irritated )   HPI Left eye irritated Patient is coming in today because she has been having some irritation in her left eye.  She says she started having this a little over a week ago and had been trying some olopatadine drops and they did help a little bit but then now it is worsening over the past couple days.  She has woken up with a little bit of white purulent drainage from the corners of her eyes along with irritation and just feels like it is not getting better.  She does admit that she is having allergies this time year and is not taking her Zyrtec regularly.  She also admits that she does not drink plenty of water and does stay little dehydrated.  Because of her not drinking a lot of water she feels like she is getting some dysuria and has dark urine with an odor sometimes.  She has had recurrent UTIs and feels like she might have one coming on again.  Relevant past medical, surgical, family and social history reviewed and updated as indicated. Interim medical history since our last visit reviewed. Allergies and medications reviewed and updated.  Review of Systems  Constitutional:  Negative for chills and fever.  HENT:  Negative for ear discharge and ear pain.   Eyes:  Positive for discharge, redness and itching. Negative for visual disturbance.  Respiratory:  Negative for chest tightness and shortness of breath.   Cardiovascular:  Negative for chest pain and leg swelling.  Genitourinary:  Negative for difficulty urinating and dysuria.  Musculoskeletal:  Negative for back pain and gait problem.  Skin:  Negative for rash.  Neurological:  Negative for light-headedness and headaches.   Psychiatric/Behavioral:  Negative for agitation and behavioral problems.   All other systems reviewed and are negative.   Per HPI unless specifically indicated above   Allergies as of 08/02/2023       Reactions   Peanut-containing Drug Products Rash   Peanuts, Peanut  butter        Medication List        Accurate as of August 02, 2023  4:26 PM. If you have any questions, ask your nurse or doctor.          cetirizine 10 MG tablet Commonly known as: ZyrTEC Allergy Take 1 tablet (10 mg total) by mouth daily.   citalopram 20 MG tablet Commonly known as: CELEXA Take 20 mg by mouth daily.   ibuprofen 600 MG tablet Commonly known as: ADVIL Take 1 tablet (600 mg total) by mouth every 6 (six) hours as needed.   metoprolol succinate 25 MG 24 hr tablet Commonly known as: TOPROL-XL Take 1 tablet (25 mg total) by mouth daily.   neomycin-polymyxin-hydrocortisone 3.5-10000-1 ophthalmic suspension Commonly known as: CORTISPORIN Place 3 drops into the left eye 4 (four) times daily for 7 days. Started by: Elige Radon Denyce Harr   norethindrone-ethinyl estradiol-FE 1-20 MG-MCG tablet Commonly known as: LOESTRIN FE Take 1 tablet by mouth daily.   Olopatadine HCl 0.2 % Soln Apply 1 drop to eye daily.  Objective:   BP 120/69   Pulse 98   Temp (!) 97.2 F (36.2 C)   Ht 6' (1.829 m)   SpO2 100%   BMI 21.84 kg/m   Wt Readings from Last 3 Encounters:  04/03/23 161 lb (73 kg) (88%, Z= 1.16)*  11/02/22 162 lb (73.5 kg) (89%, Z= 1.21)*  10/06/22 161 lb (73 kg) (88%, Z= 1.19)*   * Growth percentiles are based on CDC (Girls, 2-20 Years) data.    Physical Exam Vitals and nursing note reviewed.  Constitutional:      General: She is not in acute distress.    Appearance: She is well-developed. She is not diaphoretic.  Eyes:     Conjunctiva/sclera: Conjunctivae normal.  Cardiovascular:     Rate and Rhythm: Normal rate and regular rhythm.     Heart sounds: Normal  heart sounds. No murmur heard. Pulmonary:     Effort: Pulmonary effort is normal. No respiratory distress.     Breath sounds: Normal breath sounds. No wheezing.  Musculoskeletal:        General: No swelling.  Skin:    General: Skin is warm and dry.     Findings: No rash.  Neurological:     Mental Status: She is alert and oriented to person, place, and time.     Coordination: Coordination normal.  Psychiatric:        Behavior: Behavior normal.       Assessment & Plan:   Problem List Items Addressed This Visit   None Visit Diagnoses       Irritation of left eye    -  Primary   Relevant Medications   neomycin-polymyxin-hydrocortisone (CORTISPORIN) 3.5-10000-1 ophthalmic suspension   Other Relevant Orders   Urinalysis, Complete   Urine Culture   CBC with Differential/Platelet   CMP14+EGFR     Dysuria       Relevant Orders   Urinalysis, Complete   Urine Culture   CBC with Differential/Platelet   CMP14+EGFR     Continue olopatadine Benadryl and sent antibiotic eyedrops for  Follow up plan: Return if symptoms worsen or fail to improve.  Counseling provided for all of the vaccine components Orders Placed This Encounter  Procedures   Urine Culture   Urinalysis, Complete   CBC with Differential/Platelet   CMP14+EGFR    Arville Care, MD [facility] 08/02/2023, 4:26 PM

## 2023-08-03 ENCOUNTER — Encounter: Payer: Self-pay | Admitting: Family Medicine

## 2023-08-03 ENCOUNTER — Telehealth: Payer: Self-pay | Admitting: Family Medicine

## 2023-08-03 LAB — CBC WITH DIFFERENTIAL/PLATELET
Basophils Absolute: 0.1 10*3/uL (ref 0.0–0.2)
Basos: 1 %
EOS (ABSOLUTE): 0.3 10*3/uL (ref 0.0–0.4)
Eos: 5 %
Hematocrit: 37.4 % (ref 34.0–46.6)
Hemoglobin: 12.5 g/dL (ref 11.1–15.9)
Immature Grans (Abs): 0 10*3/uL (ref 0.0–0.1)
Immature Granulocytes: 0 %
Lymphocytes Absolute: 2 10*3/uL (ref 0.7–3.1)
Lymphs: 31 %
MCH: 28.9 pg (ref 26.6–33.0)
MCHC: 33.4 g/dL (ref 31.5–35.7)
MCV: 87 fL (ref 79–97)
Monocytes Absolute: 0.5 10*3/uL (ref 0.1–0.9)
Monocytes: 7 %
Neutrophils Absolute: 3.5 10*3/uL (ref 1.4–7.0)
Neutrophils: 56 %
Platelets: 389 10*3/uL (ref 150–450)
RBC: 4.32 x10E6/uL (ref 3.77–5.28)
RDW: 12.4 % (ref 11.7–15.4)
WBC: 6.4 10*3/uL (ref 3.4–10.8)

## 2023-08-03 LAB — CMP14+EGFR
ALT: 14 IU/L (ref 0–32)
AST: 16 IU/L (ref 0–40)
Albumin: 4.9 g/dL (ref 4.0–5.0)
Alkaline Phosphatase: 84 IU/L (ref 42–106)
BUN/Creatinine Ratio: 8 — ABNORMAL LOW (ref 9–23)
BUN: 6 mg/dL (ref 6–20)
Bilirubin Total: 0.3 mg/dL (ref 0.0–1.2)
CO2: 22 mmol/L (ref 20–29)
Calcium: 10 mg/dL (ref 8.7–10.2)
Chloride: 102 mmol/L (ref 96–106)
Creatinine, Ser: 0.77 mg/dL (ref 0.57–1.00)
Globulin, Total: 2.3 g/dL (ref 1.5–4.5)
Glucose: 90 mg/dL (ref 70–99)
Potassium: 4.2 mmol/L (ref 3.5–5.2)
Sodium: 140 mmol/L (ref 134–144)
Total Protein: 7.2 g/dL (ref 6.0–8.5)
eGFR: 113 mL/min/{1.73_m2} (ref >59)

## 2023-08-03 LAB — URINALYSIS, COMPLETE
Bilirubin, UA: NEGATIVE
Glucose, UA: NEGATIVE
Ketones, UA: NEGATIVE
Nitrite, UA: NEGATIVE
Protein,UA: NEGATIVE
RBC, UA: NEGATIVE
Specific Gravity, UA: 1.01 (ref 1.005–1.030)
Urobilinogen, Ur: 0.2 mg/dL (ref 0.2–1.0)
pH, UA: 7 (ref 5.0–7.5)

## 2023-08-03 MED ORDER — NEOMYCIN-POLYMYXIN-DEXAMETH 3.5-10000-0.1 OP SUSP: 1.0000 [drp] | Freq: Four times a day (QID) | OPHTHALMIC | 0 refills | Status: AC

## 2023-08-03 NOTE — Telephone Encounter (Signed)
 Pt made aware. She will call back if needed. Instructed to ask pharmacy what they have in stock or what her insurance will cover if there is a problem with the last Rx

## 2023-08-03 NOTE — Telephone Encounter (Signed)
 I sent a different antibiotic eyedrop for her, see if that ones covered, if not just have the pharmacy let me know which 1 will be covered and they have in stock.

## 2023-08-03 NOTE — Addendum Note (Signed)
 Addended by: Arville Care on: 08/03/2023 11:21 AM   Modules accepted: Orders

## 2023-08-03 NOTE — Telephone Encounter (Signed)
 Copied from CRM [id number]. Topic: Clinical - Prescription Issue >> Aug 03, 2023 10:21 AM Clayton Bibles wrote: Reason for CRM:  Her insurance will not cover neomycin-polymyxin-hydrocortisone (CORTISPORIN) 3.5-10000-1 ophthalmic suspension and she cannot afford the cost. Please call her in another eye drop to [facility]. Thanks

## 2023-08-05 LAB — URINE CULTURE

## 2023-08-06 ENCOUNTER — Other Ambulatory Visit: Payer: Self-pay | Admitting: Family Medicine

## 2023-08-06 MED ORDER — NITROFURANTOIN MONOHYD MACRO 100 MG PO CAPS
100.0000 mg | ORAL_CAPSULE | Freq: Two times a day (BID) | ORAL | 0 refills | Status: DC
Start: 2023-08-06 — End: 2023-08-16

## 2023-08-16 ENCOUNTER — Emergency Department (HOSPITAL_COMMUNITY)
Admission: EM | Admit: 2023-08-16 | Discharge: 2023-08-16 | Disposition: A | Discharge status: Home / Self Care | Attending: Emergency Medicine | Admitting: Emergency Medicine

## 2023-08-16 ENCOUNTER — Other Ambulatory Visit: Payer: Self-pay

## 2023-08-16 ENCOUNTER — Ambulatory Visit (HOSPITAL_COMMUNITY)
Admission: EM | Admit: 2023-08-16 | Discharge: 2023-08-16 | Disposition: A | Discharge status: Other Acute Inpatient Hospital | Attending: Nurse Practitioner | Admitting: Nurse Practitioner

## 2023-08-16 ENCOUNTER — Encounter (HOSPITAL_COMMUNITY): Payer: Self-pay

## 2023-08-16 ENCOUNTER — Encounter (HOSPITAL_COMMUNITY): Payer: Self-pay | Admitting: Emergency Medicine

## 2023-08-16 ENCOUNTER — Ambulatory Visit: Payer: Self-pay

## 2023-08-16 DIAGNOSIS — R000 Tachycardia, unspecified: Secondary | ICD-10-CM | POA: Diagnosis present

## 2023-08-16 DIAGNOSIS — R079 Chest pain, unspecified: Secondary | ICD-10-CM

## 2023-08-16 DIAGNOSIS — Z91010 Allergy to peanuts: Secondary | ICD-10-CM | POA: Insufficient documentation

## 2023-08-16 DIAGNOSIS — R9431 Abnormal electrocardiogram [ECG] [EKG]: Secondary | ICD-10-CM

## 2023-08-16 DIAGNOSIS — R002 Palpitations: Secondary | ICD-10-CM

## 2023-08-16 LAB — CBC WITH DIFFERENTIAL/PLATELET
Abs Immature Granulocytes: 0.03 10*3/uL (ref 0.00–0.07)
Basophils Absolute: 0.1 10*3/uL (ref 0.0–0.1)
Basophils Relative: 1 %
Eosinophils Absolute: 0.2 10*3/uL (ref 0.0–0.5)
Eosinophils Relative: 3 %
HCT: 35.2 % — ABNORMAL LOW (ref 36.0–46.0)
Hemoglobin: 11.9 g/dL — ABNORMAL LOW (ref 12.0–15.0)
Immature Granulocytes: 0 %
Lymphocytes Relative: 29 %
Lymphs Abs: 2.2 10*3/uL (ref 0.7–4.0)
MCH: 29 pg (ref 26.0–34.0)
MCHC: 33.8 g/dL (ref 30.0–36.0)
MCV: 85.9 fL (ref 80.0–100.0)
Monocytes Absolute: 0.4 10*3/uL (ref 0.1–1.0)
Monocytes Relative: 6 %
Neutro Abs: 4.6 10*3/uL (ref 1.7–7.7)
Neutrophils Relative %: 61 %
Platelets: 306 10*3/uL (ref 150–400)
RBC: 4.1 MIL/uL (ref 3.87–5.11)
RDW: 12.6 % (ref 11.5–15.5)
WBC: 7.6 10*3/uL (ref 4.0–10.5)
nRBC: 0 % (ref 0.0–0.2)

## 2023-08-16 LAB — URINALYSIS, ROUTINE W REFLEX MICROSCOPIC
Bilirubin Urine: NEGATIVE
Glucose, UA: NEGATIVE mg/dL
Hgb urine dipstick: NEGATIVE
Ketones, ur: NEGATIVE mg/dL
Nitrite: NEGATIVE
Protein, ur: NEGATIVE mg/dL
Specific Gravity, Urine: 1.005 (ref 1.005–1.030)
pH: 7 (ref 5.0–8.0)

## 2023-08-16 LAB — COMPREHENSIVE METABOLIC PANEL
ALT: 16 U/L (ref 0–44)
AST: 15 U/L (ref 15–41)
Albumin: 4.1 g/dL (ref 3.5–5.0)
Alkaline Phosphatase: 54 U/L (ref 38–126)
Anion gap: 10 (ref 5–15)
BUN: 7 mg/dL (ref 6–20)
CO2: 21 mmol/L — ABNORMAL LOW (ref 22–32)
Calcium: 9.6 mg/dL (ref 8.9–10.3)
Chloride: 107 mmol/L (ref 98–111)
Creatinine, Ser: 0.81 mg/dL (ref 0.44–1.00)
GFR, Estimated: >60 mL/min (ref >60)
Glucose, Bld: 103 mg/dL — ABNORMAL HIGH (ref 70–99)
Potassium: 3.4 mmol/L — ABNORMAL LOW (ref 3.5–5.1)
Sodium: 138 mmol/L (ref 135–145)
Total Bilirubin: 0.5 mg/dL (ref 0.0–1.2)
Total Protein: 7.2 g/dL (ref 6.5–8.1)

## 2023-08-16 LAB — D-DIMER, QUANTITATIVE: D-Dimer, Quant: 0.89 ug{FEU}/mL — ABNORMAL HIGH (ref 0.00–0.50)

## 2023-08-16 LAB — PREGNANCY, URINE: Preg Test, Ur: NEGATIVE

## 2023-08-16 LAB — TSH: TSH: 3.093 u[IU]/mL (ref 0.350–4.500)

## 2023-08-16 LAB — T4, FREE: Free T4: 0.77 ng/dL (ref 0.61–1.12)

## 2023-08-16 LAB — MAGNESIUM: Magnesium: 1.7 mg/dL (ref 1.7–2.4)

## 2023-08-16 NOTE — ED Notes (Signed)
 Patient is being discharged from the Urgent Care and sent to the Emergency Department via [facility]. Per Darden Palmer, NP, patient is in need of higher level of care due to chest pain and tachycardia. Patient is aware and verbalizes understanding of plan of care.  Vitals:   08/16/23 1332  BP: 120/74  Pulse: 95  Resp: 16  Temp: 99 F (37.2 C)  SpO2: 99%

## 2023-08-16 NOTE — Telephone Encounter (Signed)
 Left message for pt to return call and schedule an appt for Monday. [name] is on call this day.

## 2023-08-16 NOTE — ED Triage Notes (Signed)
 Pt BIB [facility] from Urgent Care in parking lot due to tachycardia.  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 HR up to 140's reported.

## 2023-08-16 NOTE — ED Triage Notes (Signed)
 Patient here today with c/o chest pain, rapid heart rate and SOB for a few weeks but today has been the worst. Last episode of tachycardia was just before she came here and it was 180bpm. Patient has a PFO for her heart and has tachycardia. Patient takes Metoprolol. When she was pregnant with her daughter she was taking Propanolol but recently when back to Metoprolol.

## 2023-08-16 NOTE — ED Provider Notes (Signed)
 [place] EMERGENCY DEPARTMENT AT [facility] Provider Note   CSN: [id number] Arrival date & time: 08/16/23  1422     History  No chief complaint on file.   Mary Russo is a 18 y.o. female.  HPI  Patient presents from urgent care via critical care transport.  History is obtained by those individuals and urgent care notes and the patient herself.  Patient notes a history of SVT, and last menstrual period was 3 weeks ago.  Over the past days, possibly longer she has had palpitations, weakness, fatigue with exertion.  She states that this is somewhat similar to prior episodes of SVT, though not exactly the same.  Today, with an episode of chest pain, fatigue, palpitations she went urgent care.  There she was found to have tachycardia, sent here for evaluation. Critical care transport reports the patient was awake and alert, tachycardic in transport.    Home Medications Prior to Admission medications   Medication Sig Start Date End Date Taking? Authorizing Provider  cetirizine (ZYRTEC ALLERGY) 10 MG tablet Take 1 tablet (10 mg total) by mouth daily. 01/25/23   Junie Spencer, FNP  citalopram (CELEXA) 20 MG tablet Take 20 mg by mouth daily. 01/17/22   [provider]  metoprolol succinate (TOPROL-XL) 25 MG 24 hr tablet Take 1 tablet (25 mg total) by mouth daily. 01/27/22   [name], Elige Radon, MD  norethindrone-ethinyl estradiol-FE (LOESTRIN FE) 1-20 MG-MCG tablet Take 1 tablet by mouth daily.    [provider]      Allergies    Peanut-containing drug products    Review of Systems   Review of Systems  Physical Exam Updated Vital Signs BP 104/64   Pulse 93   Temp 98.7 F (37.1 C) (Oral)   Resp (!) 22   LMP 07/26/2023 (Approximate)   SpO2 100%  Physical Exam Vitals and nursing note reviewed.  Constitutional:      General: She is not in acute distress.    Appearance: She is well-developed.  HENT:     Head: Normocephalic and atraumatic.   Eyes:     Conjunctiva/sclera: Conjunctivae normal.  Cardiovascular:     Rate and Rhythm: Regular rhythm. Tachycardia present.  Pulmonary:     Effort: Pulmonary effort is normal. No respiratory distress.     Breath sounds: Normal breath sounds. No stridor.  Abdominal:     General: There is no distension.  Skin:    General: Skin is warm and dry.  Neurological:     Mental Status: She is alert and oriented to person, place, and time.     Cranial Nerves: No cranial nerve deficit.  Psychiatric:        Mood and Affect: Mood normal.     ED Results / Procedures / Treatments   Labs (all labs ordered are listed, but only abnormal results are displayed) Labs Reviewed  URINALYSIS, ROUTINE W REFLEX MICROSCOPIC - Abnormal; Notable for the following components:      Result Value   Color, Urine STRAW (*)    Leukocytes,Ua SMALL (*)    Bacteria, UA RARE (*)    All other components within normal limits  COMPREHENSIVE METABOLIC PANEL  CBC WITH DIFFERENTIAL/PLATELET  PREGNANCY, URINE  MAGNESIUM  TSH  T4, FREE    EKG EKG Interpretation Date/Time:  Thursday August 16 2023 14:47:54 EDT Ventricular Rate:  98 PR Interval:  129 QRS Duration:  71 QT Interval:  351 QTC Calculation: 449 R Axis:   73  Text Interpretation: Sinus rhythm Low voltage, precordial leads Confirmed by Gerhard Munch [phone]) on 08/16/2023 2:57:49 PM  Radiology No results found.  Procedures Procedures    Medications Ordered in ED Medications - No data to display  ED Course/ Medical Decision Making/ A&P                                 Medical Decision Making Adult female with history of SVT presents with fatigue, palpitations, chest pain.  Patient has no history of congenital cardiac disease, no history of ischemic disease, or suspicion for arrhythmia and possibly sinus tach versus SVT versus electrolyte abnormalities, endocrine dysfunction, or anemia.  Patient is otherwise neurologically unremarkable,  hemodynamically stable. Cardiac 90s, 105, sinus, borderline Pulse ox 100% room air normal  Amount and/or Complexity of Data Reviewed Independent Historian:     Details: Critical care transport External Data Reviewed: notes.    Details: UC notes Labs: ordered. ECG/medicine tests: ordered and independent interpretation performed. Decision-making details documented in ED Course.  Risk Decision regarding hospitalization.  3:25 PM Initial studies reassuring, on signout patient awaiting all labs, repeat evaluation.  Final Clinical Impression(s) / ED Diagnoses Final diagnoses:  None    Rx / DC Orders ED Discharge Orders     None         Gerhard Munch, MD 08/16/23 1525

## 2023-08-16 NOTE — Discharge Instructions (Addendum)
 While you were in the emergency room, you had blood work done that was normal.  Based on your symptoms, I think is important for you to wear a cardiac monitor.  You should be contacted early next week for a follow-up appointment.  Come back to the emergency room if you have difficulty with your breathing, or lose consciousness.  If you not hear from cardiology by the end of next week, you can call the number listed on your referral sheet.

## 2023-08-16 NOTE — ED Provider Notes (Addendum)
  Physical Exam  BP 104/64   Pulse 93   Temp 98.7 F (37.1 C) (Oral)   Resp (!) 22   LMP 07/26/2023 (Approximate)   SpO2 100%   Physical Exam  Procedures  Procedures  ED Course / MDM    Medical Decision Making Amount and/or Complexity of Data Reviewed Labs: ordered.   Mary Russo, have assumed care for this patient.  In brief, this is a 18 year old female presents emergency department today due to palpitations weakness and fatigue.  Patient has a history of SVT, takes metoprolol.  Patient was tachycardic at urgent care, they sent transport to the emergency room for further evaluation.  Evaluated patient's labs, no anemia, EKG nontachycardic.  She has been on her cardiac monitor, has remained in the 90s.  Renal function normal.  Patient overall looks well.  PE considered, however patient's heart rate has normalized without any intervention, is not short of breath.  Out of an abundance of caution, added on a D-dimer, which was 0.87.  Applying years criteria, PE unlikely.  Will discharge patient, have provided cardiology follow-up for the patient.   Discussed this with the patient, she is agreeable for discharge and cardiology follow-up.       Mary Simmonds T, DO 08/16/23 1653    Mary Pili, DO 08/16/23 1654    Mary Simmonds T, DO 08/16/23 1859

## 2023-08-16 NOTE — Telephone Encounter (Signed)
 Copied from CRM [id number]. Topic: Clinical - Red Word Triage >> Aug 16, 2023 12:59 PM Mary Russo wrote: Red Word that prompted transfer to Nurse Triage: heart rate 106 at rest and short of breath  Chief Complaint: elevated heart rate Symptoms: elevated hr Frequency: comes and goes Pertinent Negatives: Patient denies cp, dizziness, numbness, headache Disposition: [] ED /[] Urgent Care (no appt availability in office) / [x] Appointment(In office/virtual)/ []  [place] [facility]/ [] Home Care/ [] Refused Recommended Disposition /[] [facility]/ []  Follow-up with PCP Additional Notes: apt made per protocol ; care advice given, denies questions; instructed to go to ER if becomes worse.   Reason for Disposition  [1] Palpitations AND [2] no improvement after using Care Advice  Answer Assessment - Initial Assessment Questions 1. DESCRIPTION: "Please describe your heart rate or heartbeat that you are having" (e.g., fast/slow, regular/irregular, skipped or extra beats, "palpitations")     Heart rate is 106 and she is short of breath 2. ONSET: "When did it start?" (Minutes, hours or days)      Started yesterday 3. DURATION: "How long does it last" (e.g., seconds, minutes, hours)     Resting it is in the 100 s 4. PATTERN "Does it come and go, or has it been constant since it started?"  "Does it get worse with exertion?"   "Are you feeling it now?"     Comes and goes 5. TAP: "Using your hand, can you tap out what you are feeling on a chair or table in front of you, so that I can hear?" (Note: not all patients can do this)       NA 6. HEART RATE: "Can you tell me your heart rate?" "How many beats in 15 seconds?"  (Note: not all patients can do this)       NA in 100's 7. RECURRENT SYMPTOM: "Have you ever had this before?" If Yes, ask: "When was the last time?" and "What happened that time?"      yes 8. CAUSE: "What do you think is causing the palpitations?"     Unknown  9. CARDIAC HISTORY:  "Do you have any history of heart disease?" (e.g., heart attack, angina, bypass surgery, angioplasty, arrhythmia)      denies 10. OTHER SYMPTOMS: "Do you have any other symptoms?" (e.g., dizziness, chest pain, sweating, difficulty breathing)       PFO, tachycardia 11. PREGNANCY: "Is there any chance you are pregnant?" "When was your last menstrual period?"       no  Protocols used: Heart Rate and Heartbeat Questions-A-AH

## 2023-08-16 NOTE — ED Provider Notes (Signed)
 [facility]    CSN: [id number] Arrival date & time: 08/16/23  1327      History   Chief Complaint Chief Complaint  Patient presents with   Chest Pain    HPI Mary Russo is a 18 y.o. female.   Patient presents today to urgent care with a few weeks of chest pain, rapid heart rate, and shortness of breath that has been coming on intermittently for the past few weeks.  Reports today, the symptoms have been worse and have been lasting longer than normal.  She reports history of nonsustained SVT and takes metoprolol daily, took the dose today as prescribed.  Reports prior to arrival in urgent care, her heart rate was 180 bpm.  He denies any active chest pain or shortness of breath.  She does feel little bit anxious and nervous.    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   Tachycardia, unspecified     Patient Active Problem List   Diagnosis Date Noted   Depression, major, single episode, complete remission (HCC) 10/06/2022   PFO (patent foramen ovale) 12/02/2021   NSVT (nonsustained ventricular tachycardia) (HCC) 12/02/2021   Anorexia nervosa, restricting type 01/11/2019    Past Surgical History:  Procedure Laterality Date   ANKLE RECONSTRUCTION Right 01/30/2019   Procedure: Right ankle lateral ligament reconstruction;  Surgeon: Toni Arthurs, MD;  Location: [facility];  Service: Orthopedics;  Laterality: Right;   CHOLECYSTECTOMY      OB History     Gravida  1   Para  1   Term  1   Preterm      AB      Living  1      SAB      IAB      Ectopic      Multiple  0   Live Births  1            Home Medications    Prior to Admission medications   Medication Sig  Start Date End Date Taking? Authorizing Provider  cetirizine (ZYRTEC ALLERGY) 10 MG tablet Take 1 tablet (10 mg total) by mouth daily. 01/25/23   Junie Spencer, FNP  citalopram (CELEXA) 20 MG tablet Take 20 mg by mouth daily. 01/17/22   [provider]  metoprolol succinate (TOPROL-XL) 25 MG 24 hr tablet Take 1 tablet (25 mg total) by mouth daily. 01/27/22   [name], Elige Radon, MD  norethindrone-ethinyl estradiol-FE (LOESTRIN FE) 1-20 MG-MCG tablet Take 1 tablet by mouth daily.    [provider]    Family History Family History  Problem Relation Age of Onset   Asthma Mother    COPD Mother    Kidney disease Mother    Hyperlipidemia Father    Mitral valve prolapse Father    Heart disease Father    Congestive Heart Failure Father    Learning disabilities Brother    Heart disease Maternal Grandmother    COPD Maternal Grandfather    Heart disease Maternal Grandfather    Lung cancer Paternal Grandfather     Social History Social History   Tobacco Use   Smoking status: Never  Smokeless tobacco: Never  Vaping Use   Vaping status: Never Used  Substance Use Topics   Alcohol use: No   Drug use: No     Allergies   Peanut-containing drug products   Review of Systems Review of Systems Per HPI  Physical Exam Triage Vital Signs ED Triage Vitals  Encounter Vitals Group     BP 08/16/23 1332 120/74     Systolic BP Percentile --      Diastolic BP Percentile --      Pulse Rate 08/16/23 1332 95     Resp 08/16/23 1332 16     Temp 08/16/23 1332 99 F (37.2 C)     Temp Source 08/16/23 1332 Oral     SpO2 08/16/23 1332 99 %     Weight 08/16/23 1336 161 lb (73 kg)     Height 08/16/23 1336 6' (1.829 m)     Head Circumference --      Peak Flow --      Pain Score 08/16/23 1336 7     Pain Loc --      Pain Education --      Exclude from Growth Chart --    No data found.  Updated Vital Signs BP 120/74 (BP Location: Right Arm)   Pulse (!) 123   Temp 99 F  (37.2 C) (Oral)   Resp 16   Ht 6' (1.829 m)   Wt 161 lb (73 kg)   LMP 07/26/2023 (Approximate)   SpO2 99%   Breastfeeding No   BMI 21.84 kg/m   Visual Acuity Right Eye Distance:   Left Eye Distance:   Bilateral Distance:    Right Eye Near:   Left Eye Near:    Bilateral Near:     Physical Exam Vitals and nursing note reviewed.  Constitutional:      General: She is not in acute distress.    Appearance: She is well-developed. She is not toxic-appearing.  HENT:     Head: Normocephalic and atraumatic.  Cardiovascular:     Rate and Rhythm: Tachycardia present.  Pulmonary:     Effort: Pulmonary effort is normal.  Skin:    General: Skin is warm and dry.     Capillary Refill: Capillary refill takes less than 2 seconds.     Coloration: Skin is not cyanotic or pale.     Findings: No erythema.  Neurological:     Mental Status: She is alert and oriented to person, place, and time.  Psychiatric:        Mood and Affect: Mood is anxious.      UC Treatments / Results  Labs (all labs ordered are listed, but only abnormal results are displayed) Labs Reviewed - No data to display  EKG   Radiology No results found.  Procedures Procedures (including critical care time)  Medications Ordered in UC Medications - No data to display  Initial Impression / Assessment and Plan / UC Course  I have reviewed the triage vital signs and the nursing notes.  Pertinent labs & imaging results that were available during my care of the patient were reviewed by me and considered in my medical decision making (see chart for details).   In triage, patient is slightly tachycardic, otherwise vital signs are stable.  1. Chest pain, unspecified type 2. Tachycardia 3. EKG, abnormal EKG today shows heart rate of 108 bpm with prolonged QTc I recommended further evaluation and management in the emergency room and transport by EMS Patient is  in agreement to plan EMS called, IV access initiated,  patient left urgent care in stable condition  The patient was given the opportunity to ask questions.  All questions answered to their satisfaction.  The patient is in agreement to this plan.    Final Clinical Impressions(s) / UC Diagnoses   Final diagnoses:  Chest pain, unspecified type  Tachycardia  EKG, abnormal   Discharge Instructions   None    ED Prescriptions   None    PDMP not reviewed this encounter.   Valentino Nose, NP 08/16/23 [phone]

## 2023-08-17 ENCOUNTER — Encounter: Payer: Self-pay | Admitting: Family Medicine

## 2023-08-17 ENCOUNTER — Ambulatory Visit (HOSPITAL_COMMUNITY)
Admission: RE | Admit: 2023-08-17 | Discharge: 2023-08-17 | Disposition: A | Discharge status: Home / Self Care | Source: Ambulatory Visit | Attending: Nurse Practitioner | Admitting: Nurse Practitioner

## 2023-08-17 ENCOUNTER — Telehealth: Payer: Self-pay | Admitting: Family Medicine

## 2023-08-17 ENCOUNTER — Other Ambulatory Visit: Payer: Self-pay | Admitting: Nurse Practitioner

## 2023-08-17 ENCOUNTER — Ambulatory Visit: Payer: Self-pay

## 2023-08-17 ENCOUNTER — Ambulatory Visit (INDEPENDENT_AMBULATORY_CARE_PROVIDER_SITE_OTHER): Admitting: Nurse Practitioner

## 2023-08-17 ENCOUNTER — Encounter: Payer: Self-pay | Admitting: Nurse Practitioner

## 2023-08-17 VITALS — BP 119/76 | HR 114 | Temp 98.4°F | Ht 72.0 in

## 2023-08-17 DIAGNOSIS — Z09 Encounter for follow-up examination after completed treatment for conditions other than malignant neoplasm: Secondary | ICD-10-CM

## 2023-08-17 DIAGNOSIS — R0602 Shortness of breath: Secondary | ICD-10-CM | POA: Insufficient documentation

## 2023-08-17 DIAGNOSIS — R002 Palpitations: Secondary | ICD-10-CM

## 2023-08-17 DIAGNOSIS — Q2112 Patent foramen ovale: Secondary | ICD-10-CM | POA: Diagnosis not present

## 2023-08-17 DIAGNOSIS — R7989 Other specified abnormal findings of blood chemistry: Secondary | ICD-10-CM | POA: Diagnosis not present

## 2023-08-17 DIAGNOSIS — R079 Chest pain, unspecified: Secondary | ICD-10-CM | POA: Diagnosis not present

## 2023-08-17 MED ORDER — IOHEXOL 350 MG/ML SOLN
75.0000 mL | Freq: Once | INTRAVENOUS | Status: AC | PRN
  Administered 2023-08-17: 75 mL via INTRAVENOUS

## 2023-08-17 MED ORDER — PROPRANOLOL HCL 10 MG PO TABS: 10.0000 mg | ORAL_TABLET | Freq: Two times a day (BID) | ORAL | 5 refills | Status: DC

## 2023-08-17 NOTE — Progress Notes (Signed)
 Orders Placed This Encounter  Procedures   CT Angio Chest Pulmonary Embolism (PE) W or WO Contrast    Standing Status:   Future    Expiration Date:   08/16/2024    If indicated for the ordered procedure, I authorize the administration of contrast media per Radiology protocol:   Yes    Does the patient have a contrast media/X-ray dye allergy?:   No    Is patient pregnant?:   Unknown (Please Explain)             waiting on blood test- urine was negative    Preferred imaging location?:   [facility] [place], [place]

## 2023-08-17 NOTE — Telephone Encounter (Signed)
 Pt seen by Gennette Pac in clinic today

## 2023-08-17 NOTE — Telephone Encounter (Signed)
FYI/ appt today

## 2023-08-17 NOTE — Telephone Encounter (Signed)
  Chief Complaint: lab results/anxiety Symptoms: anxiety, palpitations Frequency: ED visit yesterday, symptoms improved today Pertinent Negatives: Patient denies chest pain, worsened SOB. Disposition: [] ED /[] Urgent Care (no appt availability in office) / [] Appointment(In office/virtual)/ []  [facility]/ [] Home Care/ [] Refused Recommended Disposition /[] [facility]/ [x]  Follow-up with PCP Additional Notes: Patient calling in stating she is panicked because she was sent to the ED yesterday for chest pain and palpitations from urgent care. She states her EKG was abnormal at urgent care however, she states at ED she was told it was fine. She states she checked her mychart and found out her d-dimer was elevated. She states the ED physician told her it was normal. She states chest pain is absent this morning and her SOB is somewhat improved. She states her heart rate still feels fast but she states her nerves are not helping that. Called [facility] regarding recommended disposition and they state they can fit patient in today with Gennette Pac, FNP for ED follow up. Patient phone call merged with [facility] for scheduling.  Copied from CRM [id number]. Topic: Clinical - Red Word Triage >> Aug 17, 2023  7:53 AM Alessandra Bevels wrote: Red Word that prompted transfer to Nurse Triage: Patient is calling with concerns about blood clots. Patient received her results for D-Dimer, Quant 0.89 High. And is extremely nervous. Please advise Reason for Disposition  [1] Follow-up call from patient regarding patient's clinical status AND [2] information urgent  Answer Assessment - Initial Assessment Questions 1. REASON FOR CALL or QUESTION: "What is your reason for calling today?" or "How can I best help you?" or "What question do you have that I can help answer?"     Patient called in yesterday for triage on SOB, chest pain/palpiations. She went to urgent care and was sent to the ED. She states she has a message from  the office telling her to call in and schedule and appt on Monday with Dr Museum/gallery exhibitions officer. However, urgent care told her she had an abnormal EKG which she states the ED provider dismissed and was also told her d dimer was negative. She states she checked mychart and her d dimer is elevated. She states she is panicking and concerned about the lab value.   2. CALLER: Document the source of call. (e.g., laboratory, patient).     Patient  Protocols used: PCP Call - No Triage-A-AH

## 2023-08-17 NOTE — Progress Notes (Signed)
   Subjective:    Patient ID: Mary Russo, female    DOB: 2006-01-11, 18 y.o.   MRN: [id number]   Chief Complaint: ED follow up  Palpitations  Pertinent negatives include no chest pain, diaphoresis, dizziness, shortness of breath or weakness.    Patient went to the ED yesterday with c/o palpitations. Patient stated  to ED that she has a history of SVT. Her heart rate was 93 when she arrived at hospital. Her d dimer was lightly elevated, potassium was low at 3.4. hgb slightly low. All other labs were normal. EKG was normal. She was dx with PFO at age 36 and has been having palpitations intermittently since then. The ED did referral to cardiology.  Possibility that she may be pregnant. When she was pregnant she had lots of episodes of palpitations and heart racing. Was on propranolol while pregnant which worked better then the metoprolol she is on now. Patient Active Problem List   Diagnosis Date Noted   Depression, major, single episode, complete remission (HCC) 10/06/2022   PFO (patent foramen ovale) 12/02/2021   NSVT (nonsustained ventricular tachycardia) (HCC) 12/02/2021   Anorexia nervosa, restricting type 01/11/2019       Review of Systems  Constitutional:  Negative for diaphoresis.  Eyes:  Negative for pain.  Respiratory:  Negative for shortness of breath.   Cardiovascular:  Positive for palpitations. Negative for chest pain and leg swelling.  Gastrointestinal:  Negative for abdominal pain.  Endocrine: Negative for polydipsia.  Skin:  Negative for rash.  Neurological:  Negative for dizziness, weakness and headaches.  Hematological:  Does not bruise/bleed easily.  All other systems reviewed and are negative.      Objective:   Physical Exam Constitutional:      Appearance: Normal appearance.  Cardiovascular:     Rate and Rhythm: Normal rate and regular rhythm.     Heart sounds: Normal heart sounds.  Pulmonary:     Effort: Pulmonary effort is normal.     Breath  sounds: Normal breath sounds.  Skin:    General: Skin is warm.  Neurological:     General: No focal deficit present.     Mental Status: She is alert and oriented to person, place, and time.  Psychiatric:        Mood and Affect: Mood normal.        Behavior: Behavior normal.    BP 119/76   Pulse (!) 114   Temp 98.4 F (36.9 C) (Temporal)   Ht 6' (1.829 m)   LMP 07/26/2023 (Approximate)   SpO2 96%   BMI 21.84 kg/m         Assessment & Plan:   ASTORIA CONDON in today with chief complaint of Palpitations   1. PFO (patent foramen ovale) (Primary) Urgent referral to [facility] - Beta hCG quant (ref lab) - Ambulatory referral to Cardiology  2. Palpitations Avoid caffeine Stop metoprolol and start propranolol  3. Hospital discharge follow-up Hospital records reviewed    The above assessment and management plan was discussed with the patient. The patient verbalized understanding of and has agreed to the management plan. Patient is aware to call the clinic if symptoms persist or worsen. Patient is aware when to return to the clinic for a follow-up visit. Patient educated on when it is appropriate to go to the emergency department.   Mary-Margaret Daphine Deutscher, FNP

## 2023-08-17 NOTE — Patient Instructions (Signed)
 Palpitations Palpitations are feelings that your heartbeat is not normal. Your heartbeat may feel like it is: Uneven (irregular). Faster than normal. Fluttering. Skipping a beat. This is usually not a serious problem. However, a doctor will do tests and check your medical history to make sure that you do not have a serious heart problem. Follow these instructions at home: Watch for any changes in your condition. Tell your doctor about any changes. Take these actions to help manage your symptoms: Eating and drinking Follow instructions from your doctor about things to eat and drink. You may be told to avoid these things: Drinks that have caffeine in them, such as coffee, tea, soft drinks, and energy drinks. Chocolate. Alcohol. Diet pills. Lifestyle     Try to lower your stress. These things can help you relax: Yoga. Deep breathing and meditation. Guided imagery. This is using words and images to create positive thoughts. Exercise, including swimming, jogging, and walking. Tell your doctor if you have more abnormal heartbeats when you are active. If you have chest pain or feel short of breath with exercise, do not keep doing the exercise until you are seen by your doctor. Biofeedback. This is using your mind to control things in your body, such as your heartbeat. Get plenty of rest and sleep. Keep a regular bed time. Do not use drugs, such as cocaine or ecstasy. Do not use marijuana. Do not smoke or use any products that contain nicotine or tobacco. If you need help quitting, ask your doctor. General instructions Take over-the-counter and prescription medicines only as told by your doctor. Keep all follow-up visits. You may need more tests if palpitations do not go away or get worse. Contact a doctor if: You keep having fast or uneven heartbeats for a long time. Your symptoms happen more often. Get help right away if: You have chest pain. You feel short of breath. You have a very  bad headache. You feel dizzy. You faint. These symptoms may be an emergency. Get help right away. Call your local emergency services (911 in the [place] Do not wait to see if the symptoms will go away. Do not drive yourself to the hospital. Summary Palpitations are feelings that your heartbeat is uneven or faster than normal. It may feel like your heart is fluttering or skipping a beat. Avoid food and drinks that may cause this condition. These include caffeine, chocolate, and alcohol. Try to lower your stress. Do not smoke or use drugs. Get help right away if you faint, feel dizzy, feel short of breath, have chest pain, or have a very bad headache. This information is not intended to replace advice given to you by your health care provider. Make sure you discuss any questions you have with your health care provider. Document Revised: 10/06/2020 Document Reviewed: 10/06/2020 Elsevier Patient Education  2024 [employer].

## 2023-08-17 NOTE — Telephone Encounter (Signed)
 Pt has an appt with Gennette Pac today

## 2023-08-17 NOTE — Telephone Encounter (Signed)
 Spoke with pt and mom. Mom says pt is very worried about her health and knows she had a visit with MMM this morning who reassured pt that she didn't think pt had a blood clot but pt is worried that she might and wants to know if MMM can order a CT for her?   Please advise.

## 2023-08-18 LAB — BETA HCG QUANT (REF LAB): hCG Quant: <1 m[IU]/mL

## 2023-08-22 ENCOUNTER — Encounter: Payer: Self-pay | Admitting: Cardiology

## 2023-08-22 ENCOUNTER — Encounter: Payer: Self-pay | Admitting: Family Medicine

## 2023-08-22 ENCOUNTER — Ambulatory Visit: Attending: Cardiology | Admitting: Cardiology

## 2023-08-22 VITALS — BP 110/70 | HR 91 | Ht 72.0 in | Wt 163.8 lb

## 2023-08-22 DIAGNOSIS — I4711 Inappropriate sinus tachycardia, so stated: Secondary | ICD-10-CM | POA: Diagnosis not present

## 2023-08-22 DIAGNOSIS — R0789 Other chest pain: Secondary | ICD-10-CM

## 2023-08-22 DIAGNOSIS — Q2112 Patent foramen ovale: Secondary | ICD-10-CM | POA: Diagnosis not present

## 2023-08-22 DIAGNOSIS — R7989 Other specified abnormal findings of blood chemistry: Secondary | ICD-10-CM

## 2023-08-22 DIAGNOSIS — D649 Anemia, unspecified: Secondary | ICD-10-CM

## 2023-08-22 MED ORDER — DILTIAZEM HCL 120 MG PO TABS: 120.0000 mg | ORAL_TABLET | Freq: Four times a day (QID) | ORAL | 3 refills | Status: DC

## 2023-08-22 NOTE — Progress Notes (Signed)
 Cardiology Office Note:    Date:  08/26/2023   ID:  EMBRY MANRIQUE, DOB 2006/04/11, MRN [id number]  PCP:  [name], Elige Radon, MD  Cardiologist:  Thomasene Ripple, DO  Electrophysiologist:  None   Referring MD: Arletha Pili, DO   " I am having chest pain"   History of Present Illness:    Mary Russo is a 18 y.o. female with a hx of PFO and show 1 of NSVT during pregnancy.  She, presents with worsening symptoms of tachycardia, shortness of breath, and fatigue. She reports that her heart would "start beating out of my chest" and she would become easily out of breath. She also describes episodes where her heart rate would increase to 120-150 beats per minute, accompanied by a sensation of an "elephant sitting on my chest." These episodes leave her feeling exhausted, as if she had "ran a marathon." She has been on propranolol, but it does not seem to be controlling her symptoms. She also reports that her legs have been aching and she bruises easily. She has a history of abnormal EKGs and elevated D-dimer levels, but a recent CT angiogram was normal. She was previously on metoprolol, but stopped due to side effects.  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   citalopram (CELEXA)  20 MG tablet Take 20 mg by mouth daily.   diltiazem (CARDIZEM) 120 MG tablet Take 1 tablet (120 mg total) by mouth 4 (four) times daily.   [DISCONTINUED] propranolol (INDERAL) 10 MG tablet Take 1 tablet (10 mg total) by mouth 2 (two) times daily.     Allergies:   Peanut-containing drug products   Social History   Socioeconomic History   Marital status: Single    Spouse name: Not on file   Number of children: Not on file   Years of education: Not on file   Highest education level: 12th grade  Occupational History   Not on file  Tobacco Use   Smoking status: Never   Smokeless tobacco: Never  Vaping Use   Vaping status: Never Used  Substance and Sexual Activity   Alcohol use: No   Drug use: No   Sexual activity: Yes  Other Topics Concern   Not on file  Social History Narrative   Not on file   Social Drivers of Health   Financial Resource Strain: Low Risk  (08/02/2023)   Overall Financial Resource Strain (CARDIA)    Difficulty of Paying Living Expenses: Not very hard  Food Insecurity: No Food Insecurity (08/02/2023)   Hunger Vital Sign    Worried About Running Out of Food in the Last Year: Never true    Ran Out of Food in the Last Year: Never true  Transportation Needs: No Transportation Needs (08/02/2023)   PRAPARE - Administrator, Civil Service (Medical): No    Lack of Transportation (Non-Medical): No  Physical Activity: Sufficiently Active (08/02/2023)   Exercise Vital Sign    Days of Exercise per Week: 7 days    Minutes of Exercise per Session: 100 min  Stress: No Stress Concern Present (08/02/2023)   [employer] of Occupational Health - Occupational Stress Questionnaire    Feeling of Stress : Not at all  Social Connections: Socially Integrated (08/02/2023)   Social Connection and Isolation Panel [NHANES]    Frequency of Communication with Friends and Family: Three times a week    Frequency of Social Gatherings with Friends and Family: More than three times  a week    Attends Religious Services: More than 4 times per year    Active Member of [employer] or Organizations: Yes    Attends Engineer, structural: More than 4 times per year    Marital Status: Married     Family History: The patient's family history includes Asthma in her mother; COPD in her maternal grandfather and mother; Congestive Heart Failure in her father; Heart disease in her father, maternal grandfather, and maternal grandmother; Hyperlipidemia in her father; Kidney disease in her mother; Learning disabilities in her brother; Lung cancer in her paternal grandfather; Mitral valve prolapse in her father.  ROS:   Review of Systems  Constitution: Negative for decreased appetite, fever and weight gain.  HENT: Negative for congestion, ear discharge, hoarse voice and sore throat.   Eyes: Negative for discharge, redness, vision loss in right eye and visual halos.  Cardiovascular: Negative for chest pain, dyspnea on exertion, leg swelling, orthopnea and palpitations.  Respiratory: Negative for cough, hemoptysis, shortness of breath and snoring.   Endocrine: Negative for heat intolerance and polyphagia.  Hematologic/Lymphatic: Negative for bleeding problem. Does not bruise/bleed easily.  Skin: Negative for flushing, nail changes, rash and suspicious lesions.  Musculoskeletal: Negative for arthritis, joint pain, muscle cramps, myalgias, neck pain and stiffness.  Gastrointestinal: Negative for abdominal pain, bowel incontinence, diarrhea and excessive appetite.  Genitourinary: Negative for decreased libido, genital sores and incomplete emptying.  Neurological: Negative for brief paralysis, focal weakness, headaches and loss of balance.  Psychiatric/Behavioral: Negative for altered mental status, depression and suicidal ideas.  Allergic/Immunologic: Negative for HIV exposure and persistent infections.    EKGs/Labs/Other Studies Reviewed:    The following studies were reviewed  today:   EKG:  The ekg ordered today demonstrates   Recent Labs: 08/16/2023: ALT 16; BUN 7; Creatinine, Ser 0.81; Hemoglobin 11.9; Magnesium 1.7; Platelets 306; Potassium 3.4; Sodium 138; TSH 3.093  Recent Lipid Panel No results found for: "CHOL", "TRIG", "HDL", "CHOLHDL", "VLDL", "LDLCALC", "LDLDIRECT"  Physical Exam:    VS:  BP 110/70 (BP Location: Right Arm, Patient Position: Sitting, Cuff Size: Normal)   Pulse 91   Ht 6' (1.829 m)   Wt 163 lb 12.8 oz (74.3 kg)   LMP 07/26/2023 (Approximate)   SpO2 99%   BMI 22.22 kg/m     Wt Readings from Last 3 Encounters:  08/22/23 163 lb 12.8 oz (74.3 kg)  08/16/23 161 lb (73 kg)  04/03/23 161 lb (73 kg) (88%, Z= 1.16)*   * Growth percentiles are based on CDC (Girls, 2-20 Years) data.     GEN: Well nourished, well developed in no acute distress HEENT: Normal NECK: No JVD; No carotid bruits LYMPHATICS: No lymphadenopathy CARDIAC: S1S2 noted,RRR, no murmurs, rubs,  gallops RESPIRATORY:  Clear to auscultation without rales, wheezing or rhonchi  ABDOMEN: Soft, non-tender, non-distended, +bowel sounds, no guarding. EXTREMITIES: No edema, No cyanosis, no clubbing MUSCULOSKELETAL:  No deformity  SKIN: Warm and dry NEUROLOGIC:  Alert and oriented x 3, non-focal PSYCHIATRIC:  Normal affect, good insight  ASSESSMENT:    1. Atypical chest pain   2. Inappropriate sinus node tachycardia (HCC)   3. PFO (patent foramen ovale)    PLAN:     Tachycardia Persistent tachycardia with heart rates up to 150 bpm upon standing, accompanied by chest pressure, breathlessness, and fatigue. Differential includes inappropriate sinus tachycardia or POTS. Propranolol ineffective; Cardizem proposed as alternative with close blood pressure monitoring. - Discontinue propranolol. - Initiate Cardizem 120 mg daily and monitor blood pressure. - Schedule treadmill stress echo test. - Consider longer-acting beta-blocker if Cardizem is not tolerated.  Medication  Management Adverse reactions to metoprolol and propranolol. Cardizem considered viable, with discontinuation if pregnancy occurs. - Monitor response to Cardizem and adjust treatment as necessary. - Avoid metoprolol due to previous adverse effects.  Elevated D-dimer Elevated D-dimer noted, CTA ruled out pulmonary embolism. Further hematological evaluation warranted. - Refer to hematology for further evaluation of elevated D-dimer.  Patent Foramen Ovale (PFO) PFO not contributing to current symptoms. Closure not indicated unless associated with migraines or strokes.  Follow-up Follow-up necessary to monitor medication effectiveness and assess stress echo results. - Schedule follow-up appointment in 16 weeks. - Consider virtual follow-up if in-person visit is not feasible.  For completeness and medication were not missing early onset of coronary artery disease or any other structural abnormalities will get a stress echo  The patient is in agreement with the above plan. The patient left the office in stable condition.  The patient will follow up in   Medication Adjustments/Labs and Tests Ordered: Current medicines are reviewed at length with the patient today.  Concerns regarding medicines are outlined above.  Orders Placed This Encounter  Procedures   Cardiac Stress Test: Informed Consent Details: Physician/Practitioner Attestation; Transcribe to consent form and obtain patient signature   ECHOCARDIOGRAM STRESS TEST   Meds ordered this encounter  Medications   diltiazem (CARDIZEM) 120 MG tablet    Sig: Take 1 tablet (120 mg total) by mouth 4 (four) times daily.    Dispense:  90 tablet    Refill:  3    Patient Instructions  Medication Instructions:  Your physician has recommended you make the following change in your medication:  STOP: Propranolol  START: Cardizem 120 mg once daily *If you need a refill on your cardiac medications before your next appointment, please call your  pharmacy*   Testing/Procedures: Your physician has requested that you have a Exercise Stress Echocardiogram. Please follow instruction sheet as given.      Stress Echocardiogram Information Sheet                                                      Instructions:    1. You may take your morning medications the morning of the test.  2. Light breakfast no caffeine.  3. Dress prepared to exercise.  4. DO NOT use ANY caffeine or tobacco products 3 hours before appointment.  5. Please bring all current prescription medications.   Please note: We ask at that you not bring children with you  during ultrasound (echo/ vascular) testing. Due to room size and safety concerns, children are not allowed in the ultrasound rooms during exams. Our front office staff cannot provide observation of children in our lobby area while testing is being conducted. An adult accompanying a patient to their appointment will only be allowed in the ultrasound room at the discretion of the ultrasound technician under special circumstances. We apologize for any inconvenience.     Follow-Up: At [facility], you and your health needs are our priority.  As part of our continuing mission to provide you with exceptional heart care, we have created designated Provider Care Teams.  These Care Teams include your primary Cardiologist (physician) and Advanced Practice Providers (APPs -  Physician Assistants and Nurse Practitioners) who all work together to provide you with the care you need, when you need it.  Your next appointment:   16 week(s)  Provider:   Thomasene Ripple, DO     Other Instructions   1st Floor: - Lobby - Registration  - Pharmacy  - Lab - Cafe  2nd Floor: - PV Lab - Diagnostic Testing (echo, CT, nuclear med)  3rd Floor: - Vacant  4th Floor: - TCTS (cardiothoracic surgery) - AFib Clinic - Structural Heart Clinic - Vascular Surgery  - Vascular Ultrasound  5th Floor: - HeartCare  Cardiology (general and EP) - Clinical Pharmacy for coumadin, hypertension, lipid, weight-loss medications, and med management appointments    Valet parking services will be available as well.        Adopting a Healthy Lifestyle.  Know what a healthy weight is for you (roughly BMI <25) and aim to maintain this   Aim for 7+ servings of fruits and vegetables daily   65-80+ fluid ounces of water or unsweet tea for healthy kidneys   Limit to max 1 drink of alcohol per day; avoid smoking/tobacco   Limit animal fats in diet for cholesterol and heart health - choose grass fed whenever available   Avoid highly processed foods, and foods high in saturated/trans fats   Aim for low stress - take time to unwind and care for your mental health   Aim for 150 min of moderate intensity exercise weekly for heart health, and weights twice weekly for bone health   Aim for 7-9 hours of sleep daily   When it comes to diets, agreement about the perfect plan isnt easy to find, even among the experts. Experts at the [facility] of [employer] developed an idea known as the Healthy Eating Plate. Just imagine a plate divided into logical, healthy portions.   The emphasis is on diet quality:   Load up on vegetables and fruits - one-half of your plate: Aim for color and variety, and remember that potatoes dont count.   Go for whole grains - one-quarter of your plate: Whole wheat, barley, wheat berries, quinoa, oats, brown rice, and foods made with them. If you want pasta, go with whole wheat pasta.   Protein power - one-quarter of your plate: Fish, chicken, beans, and nuts are all healthy, versatile protein sources. Limit red meat.   The diet, however, does go beyond the plate, offering a few other suggestions.   Use healthy plant oils, such as olive, canola, soy, corn, sunflower and peanut. Check the labels, and avoid partially hydrogenated oil, which have unhealthy trans fats.   If youre  thirsty, drink water. Coffee and tea are good in moderation, but skip sugary drinks and limit milk and dairy  products to one or two daily servings.   The type of carbohydrate in the diet is more important than the amount. Some sources of carbohydrates, such as vegetables, fruits, whole grains, and beans-are healthier than others.   Finally, stay active  Signed, Thomasene Ripple, DO  08/26/2023 12:15 AM     [facility]

## 2023-08-22 NOTE — Patient Instructions (Signed)
 Medication Instructions:  Your physician has recommended you make the following change in your medication:  STOP: Propranolol  START: Cardizem 120 mg once daily *If you need a refill on your cardiac medications before your next appointment, please call your pharmacy*   Testing/Procedures: Your physician has requested that you have a Exercise Stress Echocardiogram. Please follow instruction sheet as given.      Stress Echocardiogram Information Sheet                                                      Instructions:    1. You may take your morning medications the morning of the test.  2. Light breakfast no caffeine.  3. Dress prepared to exercise.  4. DO NOT use ANY caffeine or tobacco products 3 hours before appointment.  5. Please bring all current prescription medications.   Please note: We ask at that you not bring children with you during ultrasound (echo/ vascular) testing. Due to room size and safety concerns, children are not allowed in the ultrasound rooms during exams. Our front office staff cannot provide observation of children in our lobby area while testing is being conducted. An adult accompanying a patient to their appointment will only be allowed in the ultrasound room at the discretion of the ultrasound technician under special circumstances. We apologize for any inconvenience.     Follow-Up: At [facility], you and your health needs are our priority.  As part of our continuing mission to provide you with exceptional heart care, we have created designated Provider Care Teams.  These Care Teams include your primary Cardiologist (physician) and Advanced Practice Providers (APPs -  Physician Assistants and Nurse Practitioners) who all work together to provide you with the care you need, when you need it.  Your next appointment:   16 week(s)  Provider:   Thomasene Ripple, DO     Other Instructions   1st Floor: - Lobby - Registration  - Pharmacy  -  Lab - Cafe  2nd Floor: - PV Lab - Diagnostic Testing (echo, CT, nuclear med)  3rd Floor: - Vacant  4th Floor: - TCTS (cardiothoracic surgery) - AFib Clinic - Structural Heart Clinic - Vascular Surgery  - Vascular Ultrasound  5th Floor: - [facility] (general and EP) - Clinical Pharmacy for coumadin, hypertension, lipid, weight-loss medications, and med management appointments    Valet parking services will be available as well.

## 2023-08-29 ENCOUNTER — Encounter: Payer: Self-pay | Admitting: Oncology

## 2023-08-29 ENCOUNTER — Inpatient Hospital Stay

## 2023-08-29 ENCOUNTER — Inpatient Hospital Stay: Attending: Oncology | Admitting: Oncology

## 2023-08-29 VITALS — BP 128/85 | HR 89 | Temp 97.8°F | Resp 18 | Ht 72.0 in | Wt 162.7 lb

## 2023-08-29 DIAGNOSIS — N920 Excessive and frequent menstruation with regular cycle: Secondary | ICD-10-CM

## 2023-08-29 DIAGNOSIS — D649 Anemia, unspecified: Secondary | ICD-10-CM

## 2023-08-29 DIAGNOSIS — D509 Iron deficiency anemia, unspecified: Secondary | ICD-10-CM | POA: Insufficient documentation

## 2023-08-29 DIAGNOSIS — R7989 Other specified abnormal findings of blood chemistry: Secondary | ICD-10-CM | POA: Diagnosis not present

## 2023-08-29 DIAGNOSIS — E538 Deficiency of other specified B group vitamins: Secondary | ICD-10-CM | POA: Diagnosis not present

## 2023-08-29 DIAGNOSIS — R300 Dysuria: Secondary | ICD-10-CM | POA: Insufficient documentation

## 2023-08-29 DIAGNOSIS — D500 Iron deficiency anemia secondary to blood loss (chronic): Secondary | ICD-10-CM

## 2023-08-29 DIAGNOSIS — Z79899 Other long term (current) drug therapy: Secondary | ICD-10-CM | POA: Diagnosis not present

## 2023-08-29 LAB — COMPREHENSIVE METABOLIC PANEL WITH GFR
ALT: 15 U/L (ref 0–44)
AST: 16 U/L (ref 15–41)
Albumin: 4.3 g/dL (ref 3.5–5.0)
Alkaline Phosphatase: 70 U/L (ref 38–126)
Anion gap: 12 (ref 5–15)
BUN: 5 mg/dL — ABNORMAL LOW (ref 6–20)
CO2: 22 mmol/L (ref 22–32)
Calcium: 9.5 mg/dL (ref 8.9–10.3)
Chloride: 103 mmol/L (ref 98–111)
Creatinine, Ser: 0.69 mg/dL (ref 0.44–1.00)
GFR, Estimated: >60 mL/min (ref >60)
Glucose, Bld: 95 mg/dL (ref 70–99)
Potassium: 3.6 mmol/L (ref 3.5–5.1)
Sodium: 137 mmol/L (ref 135–145)
Total Bilirubin: 0.4 mg/dL (ref 0.0–1.2)
Total Protein: 7.6 g/dL (ref 6.5–8.1)

## 2023-08-29 LAB — CBC WITH DIFFERENTIAL/PLATELET
Abs Immature Granulocytes: 0.01 10*3/uL (ref 0.00–0.07)
Basophils Absolute: 0.1 10*3/uL (ref 0.0–0.1)
Basophils Relative: 2 %
Eosinophils Absolute: 0.3 10*3/uL (ref 0.0–0.5)
Eosinophils Relative: 5 %
HCT: 38.3 % (ref 36.0–46.0)
Hemoglobin: 12.5 g/dL (ref 12.0–15.0)
Immature Granulocytes: 0 %
Lymphocytes Relative: 31 %
Lymphs Abs: 1.7 10*3/uL (ref 0.7–4.0)
MCH: 28.7 pg (ref 26.0–34.0)
MCHC: 32.6 g/dL (ref 30.0–36.0)
MCV: 87.8 fL (ref 80.0–100.0)
Monocytes Absolute: 0.4 10*3/uL (ref 0.1–1.0)
Monocytes Relative: 8 %
Neutro Abs: 2.9 10*3/uL (ref 1.7–7.7)
Neutrophils Relative %: 54 %
Platelets: 360 10*3/uL (ref 150–400)
RBC: 4.36 MIL/uL (ref 3.87–5.11)
RDW: 12.5 % (ref 11.5–15.5)
WBC: 5.3 10*3/uL (ref 4.0–10.5)
nRBC: 0 % (ref 0.0–0.2)

## 2023-08-29 LAB — PROTIME-INR
INR: 1 (ref 0.8–1.2)
Prothrombin Time: 13.5 s (ref 11.4–15.2)

## 2023-08-29 LAB — D-DIMER, QUANTITATIVE: D-Dimer, Quant: 1.18 ug{FEU}/mL — ABNORMAL HIGH (ref 0.00–0.50)

## 2023-08-29 LAB — IRON AND TIBC
Iron: 50 ug/dL (ref 28–170)
Saturation Ratios: 12 % (ref 10.4–31.8)
TIBC: 428 ug/dL (ref 250–450)
UIBC: 378 ug/dL

## 2023-08-29 LAB — FOLATE: Folate: 6.4 ng/mL (ref >5.9)

## 2023-08-29 LAB — VITAMIN B12: Vitamin B-12: 217 pg/mL (ref 180–914)

## 2023-08-29 LAB — FERRITIN: Ferritin: 5 ng/mL — ABNORMAL LOW (ref 11–307)

## 2023-08-29 MED ORDER — VITAMIN B-12 1000 MCG PO TABS: 1000.0000 ug | ORAL_TABLET | Freq: Every day | ORAL | 2 refills | Status: AC

## 2023-08-29 MED ORDER — FERROUS SULFATE 325 (65 FE) MG PO TBEC: 325.0000 mg | DELAYED_RELEASE_TABLET | ORAL | 3 refills | Status: AC

## 2023-08-29 NOTE — Progress Notes (Signed)
 [facility] at [facility]  HEMATOLOGY NEW VISIT  [name], Elige Radon, MD  REASON FOR REFERRAL: Normocytic anemia   HISTORY OF PRESENT ILLNESS: Mary Russo 18 y.o. female referred for anemia.She is accompanied by her mother today.  She has a past medical history of PFO and was recently in the ER for shortness of breath and arrhythmia.  She also reports ongoing fatigue and easy bruising.  Since she had her daughter in June 2023, patient has been reporting severe menstrual bleeding.  She did require 2 iron infusions after childbirth.She reports that she has always been borderline anemic and is currently experiencing extreme fatigue and lack of energy. She also notes easy bruising and recent leg aches. The patient underwent a CT scan to rule out a pulmonary embolism, which was negative after an elevated d-dimer. She also reports heavy menstrual bleeding since the birth of her daughter, passing quarter-sized blood clots and needing to change pads every hour and a half. The patient also mentions joint pain, which she attributes to rapid growth during her childhood.  She denies drinking alcohol.  No close family history of cancer or blood clots.  Her mom does report heavy menstrual cycles.  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  MEDICATIONS:  Current Outpatient Medications  Medication Sig Dispense Refill   cetirizine (ZYRTEC ALLERGY) 10 MG tablet Take 1 tablet (10 mg total) by mouth daily. 90 tablet 1   citalopram (CELEXA) 20 MG tablet Take 20 mg by mouth daily.     diltiazem (CARDIZEM) 120 MG tablet Take 1 tablet (120 mg total) by mouth 4 (four) times daily. 90 tablet 3   No current facility-administered medications for this visit.     REVIEW OF SYSTEMS:   Constitutional: Denies fevers, chills or night sweats Eyes: Denies  blurriness of vision Ears, nose, mouth, throat, and face: Denies mucositis or sore throat Respiratory: Denies cough, dyspnea or wheezes Cardiovascular: Denies palpitation, chest discomfort or lower extremity swelling Gastrointestinal:  Denies nausea, heartburn or change in bowel habits Skin: Denies abnormal skin rashes Lymphatics: Denies new lymphadenopathy or easy bruising Neurological:Denies numbness, tingling or new weaknesses Behavioral/Psych: Mood is stable, no new changes  All other systems were reviewed with the patient and are negative.  PHYSICAL EXAMINATION:   Vitals:   08/29/23 1000  BP: 128/85  Pulse: 89  Resp: 18  Temp: 97.8 F (36.6 C)  SpO2: 100%    GENERAL:alert, no distress and comfortable LUNGS: clear to auscultation and percussion with normal breathing effort HEART: regular rate & rhythm and no murmurs and no lower extremity edema ABDOMEN:abdomen soft, non-tender and normal bowel sounds Musculoskeletal:no cyanosis of digits and no clubbing  NEURO: alert & oriented x 3 with fluent speech  LABORATORY DATA:  I have reviewed the data as listed  Lab Results  Component Value Date   WBC 5.3 08/29/2023   NEUTROABS 2.9 08/29/2023   HGB 12.5 08/29/2023   HCT 38.3 08/29/2023   MCV 87.8 08/29/2023   PLT 360 08/29/2023      Chemistry      Component Value Date/Time   NA 137 08/29/2023 1040   NA 140 08/02/2023 1626   K 3.6 08/29/2023 1040   CL 103 08/29/2023 1040   CO2 22 08/29/2023 1040   BUN 5 (L) 08/29/2023 1040   BUN 6 08/02/2023 1626  CREATININE 0.69 08/29/2023 1040   CREATININE 0.75 01/06/2019 1357      Component Value Date/Time   CALCIUM 9.5 08/29/2023 1040   ALKPHOS 70 08/29/2023 1040   AST 16 08/29/2023 1040   ALT 15 08/29/2023 1040   BILITOT 0.4 08/29/2023 1040   BILITOT 0.3 08/02/2023 1626      Latest Reference Range & Units 08/29/23 10:40  D-Dimer, Quant 0.00 - 0.50 ug/mL-FEU 1.18 (H)  Prothrombin Time 11.4 - 15.2 seconds 13.5  INR  0.8 - 1.2  1.0  (H): Data is abnormally high   Latest Reference Range & Units 08/29/23 10:41  Iron 28 - 170 ug/dL 50  UIBC ug/dL 161  TIBC 096 - 045 ug/dL 409  Saturation Ratios 10.4 - 31.8 % 12  Ferritin 11 - 307 ng/mL 5 (L)  Folate >5.9 ng/mL 6.4  Vitamin B12 180 - 914 pg/mL 217  (L): Data is abnormally low  ASSESSMENT & PLAN:  Patient is a 18 year old female referred for anemia   Normocytic anemia Anemia likely secondary to heavy menstrual bleeding.  Previous history of iron infusions -Will repeat CBC and obtain iron labs today and other nutritional studies. -If patient has iron deficiency, will consider IV iron infusions.  Replace for any nutritional deficiencies.   Return to clinic in 2 weeks to discuss results and further management  Elevated d-dimer Patient had a recent elevated D-dimer level.  CT PE was negative for pulmonary embolism.  Patient has no increased risk of thrombosis in the family, no history of cancer in the family.  Patient reports some joint pains but no consistent symptoms close family with history of autoimmune disorder.  Recent beta-hCG was negative and patient had her menstruation last week. -Repeat D-dimer today.  If elevated will obtain lower extremity ultrasound to rule out DVT   Orders Placed This Encounter  Procedures   US Venous Img Lower Bilateral    Standing Status:   Future    Expected Date:   08/29/2023    Expiration Date:   08/28/2024    Reason for Exam (SYMPTOM  OR DIAGNOSIS REQUIRED):   Rule out DVT    Preferred imaging location?:   [facility]   Ferritin    Standing Status:   Future    Number of Occurrences:   1    Expected Date:   08/29/2023    Expiration Date:   08/28/2024   Folate    Standing Status:   Future    Number of Occurrences:   1    Expected Date:   08/29/2023    Expiration Date:   08/28/2024   Vitamin B12    Standing Status:   Future    Number of Occurrences:   1    Expected Date:   08/29/2023    Expiration Date:    08/28/2024   CBC with Differential/Platelet    Standing Status:   Future    Number of Occurrences:   1    Expected Date:   08/29/2023    Expiration Date:   08/28/2024   Comprehensive metabolic panel with GFR    Standing Status:   Future    Number of Occurrences:   1    Expected Date:   08/29/2023    Expiration Date:   08/28/2024   Iron and TIBC    Standing Status:   Future    Number of Occurrences:   1    Expected Date:   08/29/2023    Expiration Date:  08/28/2024   D-dimer, quantitative    Standing Status:   Future    Number of Occurrences:   1    Expected Date:   08/29/2023    Expiration Date:   08/28/2024   Protime-INR    Standing Status:   Future    Number of Occurrences:   1    Expected Date:   08/29/2023    Expiration Date:   08/28/2024   Ptt factor inhibitor (mixing study)    Standing Status:   Future    Number of Occurrences:   1    Expected Date:   08/29/2023    Expiration Date:   08/28/2024   Von Willebrand panel    Standing Status:   Future    Number of Occurrences:   1    Expected Date:   08/29/2023    Expiration Date:   08/28/2024    The total time spent in the appointment was 60 minutes encounter with patients including review of chart and various tests results, discussions about plan of care and coordination of care plan   All questions were answered. The patient knows to call the clinic with any problems, questions or concerns. No barriers to learning was detected.   Cindie Crumbly, MD 4/2/20253:04 PM

## 2023-08-29 NOTE — Patient Instructions (Signed)
 VISIT SUMMARY:  You came in today due to ongoing fatigue, easy bruising, and recent leg aches. You have a history of heart issues and anemia, and you recently visited the ER for heart troubles where a high D-dimer result was found. You were referred to a hematologist and have a history of severe hemorrhage during childbirth. You also report heavy menstrual bleeding and joint pain.  YOUR PLAN:  -ANEMIA: Anemia is a condition where you don't have enough healthy red blood cells to carry adequate oxygen to your body's tissues. Your mild anemia is likely due to iron deficiency from heavy menstrual bleeding. We will order blood tests to check your iron levels, vitamin B12, and folic acid. If iron deficiency is confirmed, we will schedule IV iron infusions or prescribe oral iron supplements. You will also be referred to an OBGYN to evaluate your heavy menstrual bleeding.  -BLEEDING DISORDER: A bleeding disorder is a condition that affects the way your blood normally clots. Given your history of excessive bleeding and family history, we will order tests to check for bleeding disorders and refer you to an OBGYN for further evaluation of your menstrual bleeding.  -ELEVATED D-DIMER: An elevated D-dimer can indicate blood clotting issues. Although your pulmonary embolism was ruled out, we will repeat the D-dimer test and may order an ultrasound of your legs if the D-dimer remains high.  INSTRUCTIONS:  Please schedule a follow-up appointment in two weeks to discuss your lab results and further management. Additionally, follow up with the OBGYN for evaluation of your heavy menstrual bleeding.

## 2023-08-29 NOTE — Addendum Note (Signed)
 Addended byCindie Crumbly on: 08/29/2023 03:07 PM   Modules accepted: Orders

## 2023-08-29 NOTE — Assessment & Plan Note (Signed)
 Anemia likely secondary to heavy menstrual bleeding.  Previous history of iron infusions -Will repeat CBC and obtain iron labs today and other nutritional studies. -If patient has iron deficiency, will consider IV iron infusions.  Replace for any nutritional deficiencies.   Return to clinic in 2 weeks to discuss results and further management

## 2023-08-29 NOTE — Assessment & Plan Note (Signed)
 Patient had a recent elevated D-dimer level.  CT PE was negative for pulmonary embolism.  Patient has no increased risk of thrombosis in the family, no history of cancer in the family.  Patient reports some joint pains but no consistent symptoms close family with history of autoimmune disorder.  Recent beta-hCG was negative and patient had her menstruation last week. -Repeat D-dimer today.  If elevated will obtain lower extremity ultrasound to rule out DVT

## 2023-08-30 ENCOUNTER — Encounter: Payer: Self-pay | Admitting: Oncology

## 2023-08-30 ENCOUNTER — Ambulatory Visit (HOSPITAL_BASED_OUTPATIENT_CLINIC_OR_DEPARTMENT_OTHER)
Admission: RE | Admit: 2023-08-30 | Discharge: 2023-08-30 | Disposition: A | Discharge status: Home / Self Care | Source: Ambulatory Visit | Attending: Oncology | Admitting: Oncology

## 2023-08-30 ENCOUNTER — Other Ambulatory Visit: Payer: Self-pay | Admitting: *Deleted

## 2023-08-30 DIAGNOSIS — R110 Nausea: Secondary | ICD-10-CM | POA: Diagnosis not present

## 2023-08-30 DIAGNOSIS — M79602 Pain in left arm: Secondary | ICD-10-CM | POA: Diagnosis not present

## 2023-08-30 DIAGNOSIS — R7989 Other specified abnormal findings of blood chemistry: Secondary | ICD-10-CM | POA: Insufficient documentation

## 2023-08-30 DIAGNOSIS — M79605 Pain in left leg: Secondary | ICD-10-CM | POA: Diagnosis not present

## 2023-08-30 DIAGNOSIS — N3000 Acute cystitis without hematuria: Secondary | ICD-10-CM | POA: Diagnosis not present

## 2023-08-30 LAB — COAG STUDIES INTERP REPORT

## 2023-08-30 LAB — PTT FACTOR INHIBITOR (MIXING STUDY): aPTT: 24.8 s (ref 22.9–30.2)

## 2023-08-30 LAB — VON WILLEBRAND PANEL
Coagulation Factor VIII: 137 % (ref 56–140)
Ristocetin Co-factor, Plasma: 87 % (ref 50–200)
Von Willebrand Antigen, Plasma: 84 % (ref 50–200)

## 2023-08-30 NOTE — Telephone Encounter (Signed)
 Would you like for me to put in a new STAT order and try to get her scheduled sooner.

## 2023-08-30 NOTE — Telephone Encounter (Signed)
 She also called.  She was up with pain all night.  I am going to go ahead and try to get her in for a STAT study today.

## 2023-08-31 ENCOUNTER — Inpatient Hospital Stay

## 2023-08-31 ENCOUNTER — Inpatient Hospital Stay (HOSPITAL_BASED_OUTPATIENT_CLINIC_OR_DEPARTMENT_OTHER): Admitting: Oncology

## 2023-08-31 VITALS — BP 109/65 | HR 92 | Temp 99.0°F | Resp 18

## 2023-08-31 DIAGNOSIS — R7989 Other specified abnormal findings of blood chemistry: Secondary | ICD-10-CM | POA: Diagnosis not present

## 2023-08-31 DIAGNOSIS — E538 Deficiency of other specified B group vitamins: Secondary | ICD-10-CM | POA: Diagnosis not present

## 2023-08-31 DIAGNOSIS — M2550 Pain in unspecified joint: Secondary | ICD-10-CM

## 2023-08-31 DIAGNOSIS — D509 Iron deficiency anemia, unspecified: Secondary | ICD-10-CM | POA: Diagnosis not present

## 2023-08-31 DIAGNOSIS — D649 Anemia, unspecified: Secondary | ICD-10-CM

## 2023-08-31 DIAGNOSIS — R300 Dysuria: Secondary | ICD-10-CM

## 2023-08-31 DIAGNOSIS — D500 Iron deficiency anemia secondary to blood loss (chronic): Secondary | ICD-10-CM

## 2023-08-31 LAB — URINALYSIS, ROUTINE W REFLEX MICROSCOPIC
Bilirubin Urine: NEGATIVE
Glucose, UA: NEGATIVE mg/dL
Ketones, ur: NEGATIVE mg/dL
Leukocytes,Ua: NEGATIVE
Nitrite: NEGATIVE
Protein, ur: NEGATIVE mg/dL
Specific Gravity, Urine: 1.003 — ABNORMAL LOW (ref 1.005–1.030)
pH: 7 (ref 5.0–8.0)

## 2023-08-31 LAB — HCG, QUANTITATIVE, PREGNANCY: hCG, Beta Chain, Quant, S: <1 m[IU]/mL (ref <5)

## 2023-08-31 MED ORDER — SODIUM CHLORIDE 0.9 % IV SOLN
510.0000 mg | Freq: Once | INTRAVENOUS | Status: AC
  Administered 2023-08-31: 510 mg via INTRAVENOUS
  Filled 2023-08-31: qty 510

## 2023-08-31 MED ORDER — SODIUM CHLORIDE 0.9 % IV SOLN: INTRAVENOUS | Status: DC

## 2023-08-31 NOTE — Progress Notes (Signed)
 [facility] at [facility]  HEMATOLOGY FOLLOW-UP VISIT  Mary Russo, Mary Radon, MD  REASON FOR FOLLOW-UP: Iron deficiency and elevated d-dimer  ASSESSMENT & PLAN:  Patient is a 18 y.o. female following for Iron deficiency and elevated d-dimer.  Anemia Patient has severe iron deficiency likely secondary to heavy menstrual bleeding.  Last iron labs consistent with ferritin of 5 and TSAT of 12.  Patient was not anemic on this lab check. -Start IV iron infusion today, Feraheme into 2 doses.  Discussed risk versus benefits of iron infusion also common adverse side effects like allergic reactions, nausea and headaches. -Start taking oral ferrous sulfate every other day.  Use MiraLAX for constipation. -GYN evaluation for menorrhagia  Return to clinic in 6 weeks with labs to assess response to IV iron  Elevated d-dimer High d-dimer of unclear etiology initially noted 0.89-->1.18.  Imaging performed thus far includes negative: CT angio negative for PE, upper/lower extremity duplex negative. Renal function normal. CBC normal.  Beta-hCG: Negative.  Home pregnancy test negative. Workup for DIC: PT/INR, PTT: Normal Patient has no family history of cancer or increased risk of cancer. No family history of autoimmune disorders.  Patient does report some joint pains ankle, knee, small joints of feet and hands.  -Will obtain an beta-hCG level today to rule out pregnancy.  Patient reported that in prior pregnancy she had multiple negative pregnancy tests even though she was pregnant. -Will obtain a autoimmune labs including ANA, rheumatoid factor, anti-CCP as patient reports joint pains. -Discussed with patient that her menorrhagia and iron deficiency could have contributed to activation of coagulation system causing slight elevation in D-dimer.  Will recheck a level after her iron deficiency and menorrhagia are resolved.  Return to clinic in 1 week to discuss  results  Dysuria Patient reports foul-smelling urine and increased urination since yesterday.  Also reports some abdominal pain radiating to the back. -Will obtain UA with microscopy today.  Will prescribe antibiotics if patient has UTI  Vitamin B12 deficiency Patient has mild vitamin B12 deficiency. - Start taking oral vitamin B12 1000 mcg every day   Orders Placed This Encounter  Procedures   Rheumatoid factor    Standing Status:   Future    Number of Occurrences:   1    Expected Date:   08/31/2023    Expiration Date:   08/30/2024   ANA, IFA (with reflex)    Standing Status:   Future    Number of Occurrences:   1    Expected Date:   08/31/2023    Expiration Date:   08/30/2024   Cyclic Citrul Peptide Antibody, IGG/IGA    Standing Status:   Future    Number of Occurrences:   1    Expected Date:   08/31/2023    Expiration Date:   08/30/2024   Urinalysis, Routine w reflex microscopic    Standing Status:   Future    Number of Occurrences:   1    Expected Date:   08/31/2023    Expiration Date:   08/30/2024   hCG, quantitative, pregnancy    The total time spent in the appointment was 30 minutes encounter with patients including review of chart and various tests results, discussions about plan of care and coordination of care plan   All questions were answered. The patient knows to call the clinic with any problems, questions or concerns. No barriers to learning was detected.  Cindie Crumbly, MD 4/4/202511:03 AM   SUMMARY  OF HEMATOLOGIC HISTORY: Iron deficiency:  -Likely secondary to menorrhagia  -Starting IV iron infusion today  Elevated D-dimer:  -Imaging performed thus far includes negative: CT angio negative for PE, upper/lower extremity duplex negative. -Renal function normal. CBC normal.  Beta-hCG: Negative.  Home pregnancy test negative. -Workup for DIC: PT/INR, PTT: Normal -Patient has no family history of cancer or increased risk of cancer. -No family history of autoimmune  disorders.  Patient does report some joint pains ankle, knee, small joints of feet and hands.  Will order autoimmune workup today.   INTERVAL HISTORY: Mary Russo 18 y.o. female following for iron deficiency and elevated D-dimer. Patient was accompanied by her mother today.  We discussed that her recent ultrasound showed no evidence of DVT.  Her D-dimer is slightly elevated from the last measurement.  Patient reported that she did a urine pregnancy test at home and was negative.  She does report some burning with urination with increased frequency and abdominal pain that radiates to the back.  She said she felt similarly when she was pregnant last time.  She also reported that her home pregnancy test was negative for several months before becoming positive even though she was pregnant.  She reports feeling very fatigued but denies any dyspnea on exertion, dizziness.  We discussed that elevated D-dimer is probably likely secondary to iron deficiency and menorrhagia which contributes to activation of coagulation system.  She also reports several joint pains and would like to get an autoimmune workup.  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  MEDICATIONS:  Current Outpatient Medications  Medication Sig Dispense Refill   cetirizine (ZYRTEC ALLERGY) 10 MG tablet Take 1 tablet (10 mg total) by mouth daily. 90 tablet 1   citalopram (CELEXA) 20 MG tablet Take 20 mg by mouth daily.     cyanocobalamin (VITAMIN B12) 1000 MCG tablet Take 1 tablet (1,000 mcg total) by mouth daily. 90 tablet 2   diltiazem (CARDIZEM) 120 MG tablet Take 1 tablet (120 mg total) by mouth 4 (four) times daily. 90 tablet 3   ferrous sulfate 325 (65 FE) MG EC tablet Take 1 tablet (325 mg total) by mouth every other day. 45 tablet 3   ondansetron (ZOFRAN-ODT) 8 MG disintegrating tablet Take 8 mg  by mouth every 8 (eight) hours as needed.     phenazopyridine (PYRIDIUM) 200 MG tablet Take 200 mg by mouth 3 (three) times daily.     sulfamethoxazole-trimethoprim (BACTRIM) 400-80 MG tablet Take 1 tablet by mouth 2 (two) times daily.     No current facility-administered medications for this visit.   Facility-Administered Medications Ordered in Other Visits  Medication Dose Route Frequency Provider Last Rate Last Admin   0.9 %  sodium chloride infusion   Intravenous Continuous Cindie Crumbly, MD   Stopped at 08/31/23 [phone]     REVIEW OF SYSTEMS:   Constitutional: Denies fevers, chills or night sweats Eyes: Denies blurriness of vision Ears, nose, mouth, throat, and face: Denies mucositis or sore throat Respiratory: Denies cough, dyspnea or wheezes Cardiovascular: Denies palpitation, chest discomfort or lower extremity swelling Gastrointestinal:  Denies nausea, heartburn or change in bowel habits Skin: Denies abnormal skin rashes Lymphatics: Denies new lymphadenopathy or easy bruising Neurological:Denies numbness, tingling or new weaknesses Behavioral/Psych: Mood is stable, no new changes  All other systems were reviewed with the patient and are negative.  PHYSICAL EXAMINATION:  GENERAL:alert, no distress and comfortable SKIN: skin color, texture, turgor are normal, no rashes or significant lesions LYMPH:  no palpable lymphadenopathy in the cervical, axillary or inguinal LUNGS: clear to auscultation and percussion with normal breathing effort HEART: regular rate & rhythm and no murmurs and no lower extremity edema ABDOMEN:abdomen soft, non-tender and normal bowel sounds Musculoskeletal:no cyanosis of digits and no clubbing  NEURO: alert & oriented x 3 with fluent speech  LABORATORY DATA:  I have reviewed the data as listed  Lab Results  Component Value Date   WBC 5.3 08/29/2023   NEUTROABS 2.9 08/29/2023   HGB 12.5 08/29/2023   HCT 38.3 08/29/2023   MCV 87.8 08/29/2023    PLT 360 08/29/2023       Chemistry      Component Value Date/Time   NA 137 08/29/2023 1040   NA 140 08/02/2023 1626   K 3.6 08/29/2023 1040   CL 103 08/29/2023 1040   CO2 22 08/29/2023 1040   BUN 5 (L) 08/29/2023 1040   BUN 6 08/02/2023 1626   CREATININE 0.69 08/29/2023 1040   CREATININE 0.75 01/06/2019 1357      Component Value Date/Time   CALCIUM 9.5 08/29/2023 1040   ALKPHOS 70 08/29/2023 1040   AST 16 08/29/2023 1040   ALT 15 08/29/2023 1040   BILITOT 0.4 08/29/2023 1040   BILITOT 0.3 08/02/2023 1626      Latest Reference Range & Units 08/29/23 10:41  Iron 28 - 170 ug/dL 50  UIBC ug/dL 960  TIBC 454 - 098 ug/dL 119  Saturation Ratios 10.4 - 31.8 % 12  Ferritin 11 - 307 ng/mL 5 (L)  Folate >5.9 ng/mL 6.4  Vitamin B12 180 - 914 pg/mL 217  (L): Data is abnormally low  Latest Reference Range & Units 08/29/23 10:40  Coagulation Factor VIII 56 - 140 % 137  Von Willebrand Antigen, Plasma 50 - 200 % 84  D-Dimer, Quant 0.00 - 0.50 ug/mL-FEU 1.18 (H)  Prothrombin Time 11.4 - 15.2 seconds 13.5  INR 0.8 - 1.2  1.0  APTT 22.9 - 30.2 sec 24.8  (H): Data is abnormally high  COAGULATION:  VON WILLEBRAND FACTOR ASSESSMENT CURRENT RESULTS ASSESSMENT : The VWF:Ag is normal. The JYN:WGNFAOZH is normal. The FVIII  is normal.    Latest Reference Range & Units 08/29/23 10:40  Ristocetin Co-factor, Plasma 50 - 200 % 87   RADIOGRAPHIC STUDIES: I have personally reviewed the radiological images as listed and agreed with the findings in the report.  US Venous Img Upper Bilat CLINICAL DATA:  Elevated D-dimer and upper extremity pain.  EXAM: BILATERAL UPPER EXTREMITY VENOUS DOPPLER ULTRASOUND  TECHNIQUE: Gray-scale sonography with graded compression, as well as color Doppler and duplex ultrasound were performed to evaluate the bilateral upper extremity deep venous systems from the level of the subclavian vein and including the jugular, axillary, basilic, radial, ulnar and  upper cephalic vein. Spectral Doppler was utilized to evaluate flow at rest and with distal augmentation maneuvers.  COMPARISON:  None Available.  FINDINGS: RIGHT UPPER EXTREMITY  Internal Jugular Vein: No evidence of thrombus. Normal compressibility, respiratory phasicity and response to augmentation.  Subclavian Vein: No evidence of thrombus. Normal compressibility, respiratory phasicity and response to augmentation.  Axillary Vein: No evidence of thrombus. Normal compressibility, respiratory phasicity and response to augmentation.  Cephalic Vein: No evidence of thrombus. Normal compressibility, respiratory phasicity and response to augmentation.  Basilic Vein: No evidence of thrombus. Normal compressibility, respiratory phasicity and  response to augmentation.  Brachial Veins: No evidence of thrombus. Normal compressibility, respiratory phasicity and response to augmentation.  Radial Veins: No evidence of thrombus. Normal compressibility, respiratory phasicity and response to augmentation.  Ulnar Veins: No evidence of thrombus. Normal compressibility, respiratory phasicity and response to augmentation.  Venous Reflux:  None.  Other Findings: No evidence of superficial thrombophlebitis or abnormal fluid collection.  LEFT UPPER EXTREMITY  Internal Jugular Vein: No evidence of thrombus. Normal compressibility, respiratory phasicity and response to augmentation.  Subclavian Vein: No evidence of thrombus. Normal compressibility, respiratory phasicity and response to augmentation.  Axillary Vein: No evidence of thrombus. Normal compressibility, respiratory phasicity and response to augmentation.  Cephalic Vein: No evidence of thrombus. Normal compressibility, respiratory phasicity and response to augmentation.  Basilic Vein: No evidence of thrombus. Normal compressibility, respiratory phasicity and response to augmentation.  Brachial Veins: No evidence of thrombus.  Normal compressibility, respiratory phasicity and response to augmentation.  Radial Veins: No evidence of thrombus. Normal compressibility, respiratory phasicity and response to augmentation.  Ulnar Veins: No evidence of thrombus. Normal compressibility, respiratory phasicity and response to augmentation.  Venous Reflux:  None.  Other Findings: No evidence of superficial thrombophlebitis or abnormal fluid collection.  IMPRESSION: No evidence of DVT within either upper extremity.  Electronically Signed   By: Irish Lack M.D.   On: 08/30/2023 14:25 US Venous Img Lower Bilateral CLINICAL DATA:  Elevated D-dimer.  Pain for a week  EXAM: BILATERAL LOWER EXTREMITY VENOUS DOPPLER ULTRASOUND  TECHNIQUE: Gray-scale sonography with graded compression, as well as color Doppler and duplex ultrasound were performed to evaluate the lower extremity deep venous systems from the level of the common femoral vein and including the common femoral, femoral, profunda femoral, popliteal and calf veins including the posterior tibial, peroneal and gastrocnemius veins when visible. The superficial great saphenous vein was also interrogated. Spectral Doppler was utilized to evaluate flow at rest and with distal augmentation maneuvers in the common femoral, femoral and popliteal veins.  COMPARISON:  None Available.  FINDINGS: RIGHT LOWER EXTREMITY  Common Femoral Vein: No evidence of thrombus. Normal compressibility, respiratory phasicity and response to augmentation.  Saphenofemoral Junction: No evidence of thrombus. Normal compressibility and flow on color Doppler imaging.  Profunda Femoral Vein: No evidence of thrombus. Normal compressibility and flow on color Doppler imaging.  Femoral Vein: No evidence of thrombus. Normal compressibility, respiratory phasicity and response to augmentation.  Popliteal Vein: No evidence of thrombus. Normal compressibility, respiratory phasicity and  response to augmentation.  Calf Veins: No evidence of thrombus. Normal compressibility and flow on color Doppler imaging.  Superficial Great Saphenous Vein: No evidence of thrombus. Normal compressibility.  Venous Reflux:  None.  Other Findings:  None.  LEFT LOWER EXTREMITY  Common Femoral Vein: No evidence of thrombus. Normal compressibility, respiratory phasicity and response to augmentation.  Saphenofemoral Junction: No evidence of thrombus. Normal compressibility and flow on color Doppler imaging.  Profunda Femoral Vein: No evidence of thrombus. Normal compressibility and flow on color Doppler imaging.  Femoral Vein: No evidence of thrombus. Normal compressibility, respiratory phasicity and response to augmentation.  Popliteal Vein: No evidence of thrombus. Normal compressibility, respiratory phasicity and response to augmentation.  Calf Veins: No evidence of thrombus. Normal compressibility and flow on color Doppler imaging.  Superficial Great Saphenous Vein: No evidence of thrombus. Normal compressibility.  Venous Reflux:  None.  Other Findings:  None.  IMPRESSION: No evidence of bilateral lower extremity DVT.  Electronically Signed   By: Karen Kays  M.D.   On: 08/30/2023 13:30

## 2023-08-31 NOTE — Assessment & Plan Note (Signed)
 Patient reports foul-smelling urine and increased urination since yesterday.  Also reports some abdominal pain radiating to the back. -Will obtain UA with microscopy today.  Will prescribe antibiotics if patient has UTI

## 2023-08-31 NOTE — Progress Notes (Signed)
 Patient presents today for Feraheme infusion per providers order.  Vital signs WNL.  Patient has no new complaints at this time.  Peripheral IV started and blood return noted pre and post infusion.  Treatment given today per MD orders.  Stable during infusion without adverse affects.  Vital signs stable.  No complaints at this time.  Discharge from clinic ambulatory in stable condition.  Alert and oriented X 3.  Follow up with [facility] as scheduled.

## 2023-08-31 NOTE — Assessment & Plan Note (Signed)
 Patient has mild vitamin B12 deficiency. - Start taking oral vitamin B12 1000 mcg every day

## 2023-08-31 NOTE — Assessment & Plan Note (Signed)
 High d-dimer of unclear etiology initially noted 0.89-->1.18.  Imaging performed thus far includes negative: CT angio negative for PE, upper/lower extremity duplex negative. Renal function normal. CBC normal.  Beta-hCG: Negative.  Home pregnancy test negative. Workup for DIC: PT/INR, PTT: Normal Patient has no family history of cancer or increased risk of cancer. No family history of autoimmune disorders.  Patient does report some joint pains ankle, knee, small joints of feet and hands.  -Will obtain an beta-hCG level today to rule out pregnancy.  Patient reported that in prior pregnancy she had multiple negative pregnancy tests even though she was pregnant. -Will obtain a autoimmune labs including ANA, rheumatoid factor, anti-CCP as patient reports joint pains. -Discussed with patient that her menorrhagia and iron deficiency could have contributed to activation of coagulation system causing slight elevation in D-dimer.  Will recheck a level after her iron deficiency and menorrhagia are resolved.  Return to clinic in 1 week to discuss results

## 2023-08-31 NOTE — Patient Instructions (Signed)

## 2023-08-31 NOTE — Assessment & Plan Note (Signed)
 Patient has severe iron deficiency likely secondary to heavy menstrual bleeding.  Last iron labs consistent with ferritin of 5 and TSAT of 12.  Patient was not anemic on this lab check. -Start IV iron infusion today, Feraheme into 2 doses.  Discussed risk versus benefits of iron infusion also common adverse side effects like allergic reactions, nausea and headaches. -Start taking oral ferrous sulfate every other day.  Use MiraLAX for constipation. -GYN evaluation for menorrhagia  Return to clinic in 6 weeks with labs to assess response to IV iron

## 2023-09-01 LAB — RHEUMATOID FACTOR: Rheumatoid fact SerPl-aCnc: <10 [IU]/mL (ref <14.0)

## 2023-09-03 ENCOUNTER — Ambulatory Visit: Admitting: Family Medicine

## 2023-09-03 LAB — CYCLIC CITRUL PEPTIDE ANTIBODY, IGG/IGA: CCP Antibodies IgG/IgA: 8 U (ref 0–19)

## 2023-09-04 ENCOUNTER — Ambulatory Visit (HOSPITAL_COMMUNITY): Admission: RE | Admit: 2023-09-04 | Source: Ambulatory Visit

## 2023-09-06 LAB — ANTINUCLEAR ANTIBODIES, IFA: ANA Ab, IFA: NEGATIVE

## 2023-09-07 ENCOUNTER — Telehealth: Payer: Self-pay | Admitting: Family Medicine

## 2023-09-07 ENCOUNTER — Inpatient Hospital Stay

## 2023-09-07 ENCOUNTER — Inpatient Hospital Stay (HOSPITAL_BASED_OUTPATIENT_CLINIC_OR_DEPARTMENT_OTHER): Admitting: Oncology

## 2023-09-07 VITALS — BP 103/61 | HR 95 | Temp 98.6°F | Resp 18

## 2023-09-07 VITALS — Wt 163.4 lb

## 2023-09-07 DIAGNOSIS — R7989 Other specified abnormal findings of blood chemistry: Secondary | ICD-10-CM

## 2023-09-07 DIAGNOSIS — D509 Iron deficiency anemia, unspecified: Secondary | ICD-10-CM | POA: Diagnosis not present

## 2023-09-07 DIAGNOSIS — D649 Anemia, unspecified: Secondary | ICD-10-CM

## 2023-09-07 MED ORDER — SODIUM CHLORIDE 0.9 % IV SOLN: INTRAVENOUS | Status: DC

## 2023-09-07 MED ORDER — SODIUM CHLORIDE 0.9 % IV SOLN
510.0000 mg | Freq: Once | INTRAVENOUS | Status: AC
  Administered 2023-09-07: 510 mg via INTRAVENOUS
  Filled 2023-09-07: qty 510

## 2023-09-07 NOTE — Assessment & Plan Note (Signed)
 Patient has severe iron deficiency likely secondary to heavy menstrual bleeding.  Last iron labs consistent with ferritin of 5 and TSAT of 12.  Patient was not anemic on this lab check. -S/p 1 dose of IV Feraheme.  Second dose due today. -Continue taking oral ferrous sulfate every other day.  Use MiraLAX for constipation. -GYN evaluation for menorrhagia  Return to clinic in 4 weeks with labs to assess response to IV iron

## 2023-09-07 NOTE — Progress Notes (Signed)
 [facility] at [facility]  HEMATOLOGY FOLLOW-UP VISIT  [name], Elige Radon, MD  REASON FOR FOLLOW-UP: Iron deficiency and elevated d-dimer  ASSESSMENT & PLAN:  Patient is a 18 y.o. female following for Iron deficiency and elevated d-dimer.  Anemia Patient has severe iron deficiency likely secondary to heavy menstrual bleeding.  Last iron labs consistent with ferritin of 5 and TSAT of 12.  Patient was not anemic on this lab check. -S/p 1 dose of IV Feraheme.  Second dose due today. -Continue taking oral ferrous sulfate every other day.  Use MiraLAX for constipation. -GYN evaluation for menorrhagia  Return to clinic in 4 weeks with labs to assess response to IV iron  Elevated d-dimer High d-dimer of unclear etiology initially noted 0.89-->1.18.  Imaging performed thus far includes negative: CT angio negative for PE, upper/lower extremity duplex negative. Renal function normal. CBC normal.  Beta-hCG: Negative.  Home pregnancy test negative. Workup for DIC: PT/INR, PTT: Normal ANA, RF, anti-CCP antibodies: Negative Patient has no family history of cancer or increased risk of cancer in close family. No family history of autoimmune disorders.  Patient does report some joint pains ankle, knee, small joints of feet and hands.  -Discussed with patient that her menorrhagia and iron deficiency could have contributed to activation of coagulation system causing slight elevation in D-dimer.  Will recheck a level after her iron deficiency and menorrhagia are resolved. - Discussed in detail that D-dimer is a very nonspecific test and significant elevations are usually seen in cancers.  This mild elevations if unlikely secondary to iron deficiency or menorrhagia by itself  Return to clinic in 4 weeks with labs- repeating d dimer    Orders Placed This Encounter  Procedures   Ferritin    Standing Status:   Future    Expected Date:   10/05/2023    Expiration Date:    09/06/2024   Folate    Standing Status:   Future    Expected Date:   10/05/2023    Expiration Date:   09/06/2024   Vitamin B12    Standing Status:   Future    Expected Date:   10/05/2023    Expiration Date:   09/06/2024   CBC with Differential/Platelet    Standing Status:   Future    Expected Date:   10/05/2023    Expiration Date:   09/06/2024   Comprehensive metabolic panel with GFR    Standing Status:   Future    Expected Date:   10/05/2023    Expiration Date:   09/06/2024   Iron and TIBC    Standing Status:   Future    Expected Date:   10/05/2023    Expiration Date:   09/06/2024   D-dimer, quantitative    Standing Status:   Future    Expected Date:   10/05/2023    Expiration Date:   09/06/2024    The total time spent in the appointment was 20 minutes encounter with patients including review of chart and various tests results, discussions about plan of care and coordination of care plan   All questions were answered. The patient knows to call the clinic with any problems, questions or concerns. No barriers to learning was detected.  Cindie Crumbly, MD 4/11/20253:03 PM   SUMMARY OF HEMATOLOGIC HISTORY: Iron deficiency:  -Likely secondary to menorrhagia  -Starting IV iron infusion today  Elevated D-dimer:  -Imaging performed thus far includes negative: CT angio negative for PE, upper/lower extremity  duplex negative. -Renal function normal. CBC normal.  Beta-hCG: Negative.  Home pregnancy test negative. -Workup for DIC: PT/INR, PTT: Normal -Patient has no family history of cancer or increased risk of cancer. -No family history of autoimmune disorders.  Patient does report some joint pains ankle, knee, small joints of feet and hands.   - ANA, RF, anti-CCP: Negative - Repeat beta-hCG: Negative   INTERVAL HISTORY: Mary Russo 18 y.o. female following for iron deficiency and elevated D-dimer.  Patient is receiving her second dose of IV Feraheme today.  She is accompanied by her mother  today.  SShe also reports sharp, stabbing pain under the left rib, which she describes as similar to gas pain. The pain is severe enough to take her breath away and forces her to sit down until it subsides. The patient denies that the pain is due to gas. She has been taking Bactrim for a suspected UTI, as prescribed by urgent care.  She has no other complaints today.  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  MEDICATIONS:  Current Outpatient Medications  Medication Sig Dispense Refill   cetirizine (ZYRTEC ALLERGY) 10 MG tablet Take 1 tablet (10 mg total) by mouth daily. 90 tablet 1   citalopram (CELEXA) 20 MG tablet Take 20 mg by mouth daily.     cyanocobalamin (VITAMIN B12) 1000 MCG tablet Take 1 tablet (1,000 mcg total) by mouth daily. 90 tablet 2   diltiazem (CARDIZEM) 120 MG tablet Take 1 tablet (120 mg total) by mouth 4 (four) times daily. 90 tablet 3   ferrous sulfate 325 (65 FE) MG EC tablet Take 1 tablet (325 mg total) by mouth every other day. 45 tablet 3   ondansetron (ZOFRAN-ODT) 8 MG disintegrating tablet Take 8 mg by mouth every 8 (eight) hours as needed.     phenazopyridine (PYRIDIUM) 200 MG tablet Take 200 mg by mouth 3 (three) times daily.     sulfamethoxazole-trimethoprim (BACTRIM) 400-80 MG tablet Take 1 tablet by mouth 2 (two) times daily.     No current facility-administered medications for this visit.   Facility-Administered Medications Ordered in Other Visits  Medication Dose Route Frequency Provider Last Rate Last Admin   0.9 %  sodium chloride infusion   Intravenous Continuous Cindie Crumbly, MD   Stopped at 09/07/23 1032     REVIEW OF SYSTEMS:   Constitutional: Denies fevers, chills or night sweats Eyes: Denies blurriness of vision Ears, nose, mouth, throat, and face: Denies mucositis or sore throat Respiratory: Denies cough,  dyspnea or wheezes Cardiovascular: Denies palpitation, chest discomfort or lower extremity swelling Gastrointestinal:  Denies nausea, heartburn or change in bowel habits Skin: Denies abnormal skin rashes Lymphatics: Denies new lymphadenopathy or easy bruising Neurological:Denies numbness, tingling or new weaknesses Behavioral/Psych: Mood is stable, no new changes  All other systems were reviewed with the patient and are negative.  PHYSICAL EXAMINATION:  Today's Vitals   09/07/23 1006  Weight: 163 lb 6.4 oz (74.1 kg)  PainSc: 5    Body mass index is 22.16 kg/m.   GENERAL:alert, no distress and comfortable SKIN: skin color, texture, turgor are normal, no rashes or significant lesions LYMPH:  no palpable lymphadenopathy in the cervical, axillary or inguinal LUNGS: clear to auscultation and percussion with normal breathing effort HEART: regular rate & rhythm and no murmurs and no lower extremity edema ABDOMEN:abdomen soft, non-tender and normal  bowel sounds Musculoskeletal:no cyanosis of digits and no clubbing  NEURO: alert & oriented x 3 with fluent speech  LABORATORY DATA:  I have reviewed the data as listed  Lab Results  Component Value Date   WBC 5.3 08/29/2023   NEUTROABS 2.9 08/29/2023   HGB 12.5 08/29/2023   HCT 38.3 08/29/2023   MCV 87.8 08/29/2023   PLT 360 08/29/2023       Chemistry      Component Value Date/Time   NA 137 08/29/2023 1040   NA 140 08/02/2023 1626   K 3.6 08/29/2023 1040   CL 103 08/29/2023 1040   CO2 22 08/29/2023 1040   BUN 5 (L) 08/29/2023 1040   BUN 6 08/02/2023 1626   CREATININE 0.69 08/29/2023 1040   CREATININE 0.75 01/06/2019 1357      Component Value Date/Time   CALCIUM 9.5 08/29/2023 1040   ALKPHOS 70 08/29/2023 1040   AST 16 08/29/2023 1040   ALT 15 08/29/2023 1040   BILITOT 0.4 08/29/2023 1040   BILITOT 0.3 08/02/2023 1626      Latest Reference Range & Units 08/29/23 10:41  Iron 28 - 170 ug/dL 50  UIBC ug/dL 161  TIBC  096 - 045 ug/dL 409  Saturation Ratios 10.4 - 31.8 % 12  Ferritin 11 - 307 ng/mL 5 (L)  Folate >5.9 ng/mL 6.4  Vitamin B12 180 - 914 pg/mL 217  (L): Data is abnormally low  Latest Reference Range & Units 08/29/23 10:40  Coagulation Factor VIII 56 - 140 % 137  Von Willebrand Antigen, Plasma 50 - 200 % 84  D-Dimer, Quant 0.00 - 0.50 ug/mL-FEU 1.18 (H)  Prothrombin Time 11.4 - 15.2 seconds 13.5  INR 0.8 - 1.2  1.0  APTT 22.9 - 30.2 sec 24.8  (H): Data is abnormally high  COAGULATION:  VON WILLEBRAND FACTOR ASSESSMENT CURRENT RESULTS ASSESSMENT : The VWF:Ag is normal. The WJX:BJYNWGNF is normal. The FVIII  is normal.    Latest Reference Range & Units 08/29/23 10:40  Ristocetin Co-factor, Plasma 50 - 200 % 87    Latest Reference Range & Units 08/31/23 09:40  HCG, Beta Chain, Quant, S <5 mIU/mL <1  ANA Ab, IFA  Negative  CCP Antibodies IgG/IgA 0 - 19 units 8  RA Latex Turbid. <14.0 IU/mL <10.0

## 2023-09-07 NOTE — Progress Notes (Signed)
 Patient presents today for Feraheme infusion per providers order.  Vital signs WNL.  Patient has no new complaints at this time.  Peripheral IV started and blood return noted pre and post infusion.  Stable during infusion without adverse affects.  Vital signs stable.  No complaints at this time.  Discharge from clinic ambulatory in stable condition.  Alert and oriented X 3.  Follow up with [facility] as scheduled.

## 2023-09-07 NOTE — Assessment & Plan Note (Signed)
 High d-dimer of unclear etiology initially noted 0.89-->1.18.  Imaging performed thus far includes negative: CT angio negative for PE, upper/lower extremity duplex negative. Renal function normal. CBC normal.  Beta-hCG: Negative.  Home pregnancy test negative. Workup for DIC: PT/INR, PTT: Normal ANA, RF, anti-CCP antibodies: Negative Patient has no family history of cancer or increased risk of cancer in close family. No family history of autoimmune disorders.  Patient does report some joint pains ankle, knee, small joints of feet and hands.  -Discussed with patient that her menorrhagia and iron deficiency could have contributed to activation of coagulation system causing slight elevation in D-dimer.  Will recheck a level after her iron deficiency and menorrhagia are resolved. - Discussed in detail that D-dimer is a very nonspecific test and significant elevations are usually seen in cancers.  This mild elevations if unlikely secondary to iron deficiency or menorrhagia by itself  Return to clinic in 4 weeks with labs- repeating d dimer

## 2023-09-07 NOTE — Patient Instructions (Signed)

## 2023-09-07 NOTE — Patient Instructions (Signed)
 VISIT SUMMARY:  You came in today with concerns about daily lower back, hip, and leg pain, as well as sharp pain under your left rib. We discussed your history of recurrent urinary tract infections (UTIs) and heavy menstrual bleeding, which has led to iron deficiency. You also mentioned a family history of various cancers, including colon and lung cancer.  YOUR PLAN:  -IRON DEFICIENCY ANEMIA: Iron deficiency anemia is a condition where your body lacks enough iron to produce healthy red blood cells, often leading to fatigue. This is likely due to your heavy menstrual bleeding. We will consult a gynecologist to manage your heavy menstrual bleeding and have scheduled iron labs and a D-dimer test.   INSTRUCTIONS:  Please follow up on May 9th for your visit. Additionally, consult with your gynecologist for managing heavy menstrual bleeding and with your primary care provider for recurrent UTIs and left rib pain.

## 2023-09-07 NOTE — Telephone Encounter (Unsigned)
 Copied from CRM [id number]. Topic: Referral - Request for Referral >> Sep 07, 2023 10:45 AM Higinio Roger wrote: Did the patient discuss referral with their provider in the last year? Yes.  Hematologist at [facility] told patient she would need a referral from Dr. Louanne Skye for Gastroenterology.  (If No - schedule appointment) (If Yes - send message)  Appointment offered? No  Type of order/referral and detailed reason for visit: Gastroenterology  Preference of office, provider, location:   [facility] Gastroenterology - [facility]  [address] [place], [place] [address] Office: [phone] Fax: [phone]  If referral order, have you been seen by this specialty before? No (If Yes, this issue or another issue? When? Where?  Can we respond through MyChart? Yes

## 2023-09-10 ENCOUNTER — Telehealth: Payer: Self-pay | Admitting: Family Medicine

## 2023-09-10 NOTE — Telephone Encounter (Signed)
 Copied from CRM [id number]. Topic: Referral - Request for Referral >> Sep 07, 2023 10:45 AM Alpha Arts wrote: Did the patient discuss referral with their provider in the last year? Yes.  Hematologist at [facility] told patient she would need a referral from Dr. Steen Eden for Gastroenterology.  (If No - schedule appointment) (If Yes - send message)  Appointment offered? No  Type of order/referral and detailed reason for visit: Gastroenterology  Preference of office, provider, location:   [facility] Gastroenterology - [facility]  [address] [place], [place] [address] Office: [phone] Fax: [phone]  If referral order, have you been seen by this specialty before? No (If Yes, this issue or another issue? When? Where?  Can we respond through MyChart? Yes >> Sep 10, 2023 11:59 AM Chuck Crater wrote: Patient mom is calling for an update on referral to gastroenterology.

## 2023-09-11 ENCOUNTER — Encounter (HOSPITAL_COMMUNITY): Payer: Self-pay

## 2023-09-11 NOTE — Telephone Encounter (Signed)
 Called and spoke with patient and advised that PCP was out of the office this week. Patient states that this message is ok to wait until he returns. Patient also states that she asked her hematologist to make the referral and she said she needed to contact her PCP

## 2023-09-12 ENCOUNTER — Inpatient Hospital Stay: Admitting: Oncology

## 2023-09-17 NOTE — Telephone Encounter (Signed)
 Is she requesting this because the hematologist wants her to see gastroenterologist for her anemia?  Or is she having a different issue.  If she is having a different issue then she will likely need to be seen so I can place the referral.

## 2023-09-17 NOTE — Telephone Encounter (Signed)
 Pt states that she needs to be seen for stomach issues and that Dr. Steen Eden has not seen her for it. Pt would like for the appt to be virtual if at all possible. Appt made for 5/1 at pts request.

## 2023-09-19 ENCOUNTER — Ambulatory Visit (HOSPITAL_COMMUNITY)
Admission: RE | Admit: 2023-09-19 | Discharge: 2023-09-19 | Disposition: A | Discharge status: Home / Self Care | Source: Ambulatory Visit | Attending: Cardiovascular Disease | Admitting: Cardiovascular Disease

## 2023-09-19 ENCOUNTER — Ambulatory Visit (HOSPITAL_COMMUNITY)

## 2023-09-19 ENCOUNTER — Encounter: Payer: Self-pay | Admitting: Oncology

## 2023-09-19 DIAGNOSIS — R0789 Other chest pain: Secondary | ICD-10-CM | POA: Diagnosis not present

## 2023-09-19 LAB — ECHOCARDIOGRAM STRESS TEST
Area-P 1/2: 4.36 cm2
S' Lateral: 3.1 cm

## 2023-09-19 MED ORDER — PERFLUTREN LIPID MICROSPHERE
1.0000 mL | INTRAVENOUS | Status: AC | PRN
Start: 2023-09-19 — End: 2023-09-19
  Administered 2023-09-19: 6 mL via INTRAVENOUS

## 2023-09-20 ENCOUNTER — Encounter: Payer: Self-pay | Admitting: Cardiology

## 2023-09-27 ENCOUNTER — Telehealth: Admitting: Family Medicine

## 2023-09-27 ENCOUNTER — Encounter: Payer: Self-pay | Admitting: Family Medicine

## 2023-09-27 DIAGNOSIS — K219 Gastro-esophageal reflux disease without esophagitis: Secondary | ICD-10-CM | POA: Diagnosis not present

## 2023-09-27 DIAGNOSIS — D500 Iron deficiency anemia secondary to blood loss (chronic): Secondary | ICD-10-CM

## 2023-09-27 MED ORDER — PANTOPRAZOLE SODIUM 40 MG PO TBEC: 40.0000 mg | DELAYED_RELEASE_TABLET | Freq: Every day | ORAL | 3 refills | Status: AC

## 2023-09-27 NOTE — Progress Notes (Signed)
 Virtual Visit via MyChart video note  I connected with Mary Russo on 09/27/23 at 0915 by video and verified that I am speaking with the correct person using two identifiers. Mary Russo is currently located at home and patient are currently with her during visit. The provider, Mary Sabin Smriti Barkow, MD is located in their office at time of visit.  Call ended at [phone]  I discussed the limitations, risks, security and privacy concerns of performing an evaluation and management service by video and the availability of in person appointments. I also discussed with the patient that there may be a patient responsible charge related to this service. The patient expressed understanding and agreed to proceed.   History and Present Illness: Patient is calling in for stomach issues.  She is having it come and go.  She feels a hunger burn in her stomach.  Sometimes it gets better after eating but sometimes not.  This has been off and on for quite a few months. She takes a tums and it helps some.  She denies blood in stool and is on iron so does have some dark stools.  She has BM's sometimes 1 times per day. She has tried gas-x and it doesn't help much.  She does get nausea sometimes in the mornings.   1. Gastroesophageal reflux disease without esophagitis   2. Iron deficiency anemia due to chronic blood loss     Outpatient Encounter Medications as of 09/27/2023  Medication Sig   pantoprazole  (PROTONIX ) 40 MG tablet Take 1 tablet (40 mg total) by mouth daily.   cetirizine  (ZYRTEC  ALLERGY) 10 MG tablet Take 1 tablet (10 mg total) by mouth daily.   citalopram  (CELEXA ) 20 MG tablet Take 20 mg by mouth daily.   cyanocobalamin  (VITAMIN B12) 1000 MCG tablet Take 1 tablet (1,000 mcg total) by mouth daily.   diltiazem  (CARDIZEM ) 120 MG tablet Take 1 tablet (120 mg total) by mouth 4 (four) times daily.   ferrous sulfate  325 (65 FE) MG EC tablet Take 1 tablet (325 mg total) by mouth every other day.    ondansetron  (ZOFRAN -ODT) 8 MG disintegrating tablet Take 8 mg by mouth every 8 (eight) hours as needed.   phenazopyridine (PYRIDIUM) 200 MG tablet Take 200 mg by mouth 3 (three) times daily.   sulfamethoxazole -trimethoprim  (BACTRIM ) 400-80 MG tablet Take 1 tablet by mouth 2 (two) times daily.   No facility-administered encounter medications on file as of 09/27/2023.    Review of Systems  Constitutional:  Negative for chills and fever.  Eyes:  Negative for visual disturbance.  Respiratory:  Negative for chest tightness and shortness of breath.   Cardiovascular:  Negative for chest pain and leg swelling.  Gastrointestinal:  Positive for abdominal pain and nausea. Negative for anal bleeding, blood in stool, constipation, diarrhea and vomiting.  Musculoskeletal:  Negative for back pain and gait problem.  Skin:  Negative for rash.  Neurological:  Negative for light-headedness and headaches.  Psychiatric/Behavioral:  Negative for agitation and behavioral problems.   All other systems reviewed and are negative.   Observations/Objective: Patient sounds comfortable and in no acute distress  Assessment and Plan: Problem List Items Addressed This Visit       Other   Anemia   Relevant Medications   pantoprazole  (PROTONIX ) 40 MG tablet   Other Relevant Orders   Ambulatory referral to Gastroenterology   Other Visit Diagnoses       Gastroesophageal reflux disease without esophagitis    -  Primary   Relevant Medications   pantoprazole  (PROTONIX ) 40 MG tablet   Other Relevant Orders   Ambulatory referral to Gastroenterology       Will start her on Protonix , it sounds GERD or possibly ulcer related.  Will also do referral to gastroenterology for her if it does not improve because she is having the chronic anemia and blood loss. Follow up plan: Return if symptoms worsen or fail to improve.     I discussed the assessment and treatment plan with the patient. The patient was provided an  opportunity to ask questions and all were answered. The patient agreed with the plan and demonstrated an understanding of the instructions.   The patient was advised to call back or seek an in-person evaluation if the symptoms worsen or if the condition fails to improve as anticipated.  The above assessment and management plan was discussed with the patient. The patient verbalized understanding of and has agreed to the management plan. Patient is aware to call the clinic if symptoms persist or worsen. Patient is aware when to return to the clinic for a follow-up visit. Patient educated on when it is appropriate to go to the emergency department.    I provided 11 minutes of non-face-to-face time during this encounter.    Mary Lucky, MD

## 2023-09-28 ENCOUNTER — Inpatient Hospital Stay: Attending: Hematology

## 2023-09-28 ENCOUNTER — Encounter: Payer: Self-pay | Admitting: Oncology

## 2023-09-28 DIAGNOSIS — E611 Iron deficiency: Secondary | ICD-10-CM | POA: Diagnosis not present

## 2023-09-28 DIAGNOSIS — E538 Deficiency of other specified B group vitamins: Secondary | ICD-10-CM | POA: Insufficient documentation

## 2023-09-28 DIAGNOSIS — R7989 Other specified abnormal findings of blood chemistry: Secondary | ICD-10-CM | POA: Diagnosis not present

## 2023-09-28 DIAGNOSIS — D509 Iron deficiency anemia, unspecified: Secondary | ICD-10-CM

## 2023-09-28 LAB — COMPREHENSIVE METABOLIC PANEL WITH GFR
ALT: 16 U/L (ref 0–44)
AST: 15 U/L (ref 15–41)
Albumin: 4.6 g/dL (ref 3.5–5.0)
Alkaline Phosphatase: 63 U/L (ref 38–126)
Anion gap: 9 (ref 5–15)
BUN: 7 mg/dL (ref 6–20)
CO2: 24 mmol/L (ref 22–32)
Calcium: 9.7 mg/dL (ref 8.9–10.3)
Chloride: 104 mmol/L (ref 98–111)
Creatinine, Ser: 0.65 mg/dL (ref 0.44–1.00)
GFR, Estimated: >60 mL/min (ref >60)
Glucose, Bld: 102 mg/dL — ABNORMAL HIGH (ref 70–99)
Potassium: 3.6 mmol/L (ref 3.5–5.1)
Sodium: 137 mmol/L (ref 135–145)
Total Bilirubin: 0.5 mg/dL (ref 0.0–1.2)
Total Protein: 7.8 g/dL (ref 6.5–8.1)

## 2023-09-28 LAB — IRON AND TIBC
Iron: 100 ug/dL (ref 28–170)
Saturation Ratios: 32 % — ABNORMAL HIGH (ref 10.4–31.8)
TIBC: 317 ug/dL (ref 250–450)
UIBC: 217 ug/dL

## 2023-09-28 LAB — CBC WITH DIFFERENTIAL/PLATELET
Abs Immature Granulocytes: 0.01 10*3/uL (ref 0.00–0.07)
Basophils Absolute: 0.1 10*3/uL (ref 0.0–0.1)
Basophils Relative: 1 %
Eosinophils Absolute: 0.3 10*3/uL (ref 0.0–0.5)
Eosinophils Relative: 5 %
HCT: 38.9 % (ref 36.0–46.0)
Hemoglobin: 13.2 g/dL (ref 12.0–15.0)
Immature Granulocytes: 0 %
Lymphocytes Relative: 29 %
Lymphs Abs: 1.7 10*3/uL (ref 0.7–4.0)
MCH: 30.8 pg (ref 26.0–34.0)
MCHC: 33.9 g/dL (ref 30.0–36.0)
MCV: 90.7 fL (ref 80.0–100.0)
Monocytes Absolute: 0.4 10*3/uL (ref 0.1–1.0)
Monocytes Relative: 7 %
Neutro Abs: 3.3 10*3/uL (ref 1.7–7.7)
Neutrophils Relative %: 58 %
Platelets: 342 10*3/uL (ref 150–400)
RBC: 4.29 MIL/uL (ref 3.87–5.11)
RDW: 13.2 % (ref 11.5–15.5)
WBC: 5.8 10*3/uL (ref 4.0–10.5)
nRBC: 0 % (ref 0.0–0.2)

## 2023-09-28 LAB — VITAMIN B12: Vitamin B-12: 481 pg/mL (ref 180–914)

## 2023-09-28 LAB — FERRITIN: Ferritin: 114 ng/mL (ref 11–307)

## 2023-09-28 LAB — FOLATE: Folate: 6.2 ng/mL (ref >5.9)

## 2023-09-28 LAB — D-DIMER, QUANTITATIVE: D-Dimer, Quant: 0.67 ug{FEU}/mL — ABNORMAL HIGH (ref 0.00–0.50)

## 2023-10-04 DIAGNOSIS — N939 Abnormal uterine and vaginal bleeding, unspecified: Secondary | ICD-10-CM | POA: Diagnosis not present

## 2023-10-05 ENCOUNTER — Inpatient Hospital Stay (HOSPITAL_BASED_OUTPATIENT_CLINIC_OR_DEPARTMENT_OTHER): Admitting: Oncology

## 2023-10-05 VITALS — BP 105/68 | HR 84 | Temp 97.7°F | Resp 16 | Wt 165.8 lb

## 2023-10-05 DIAGNOSIS — E611 Iron deficiency: Secondary | ICD-10-CM | POA: Insufficient documentation

## 2023-10-05 DIAGNOSIS — D500 Iron deficiency anemia secondary to blood loss (chronic): Secondary | ICD-10-CM

## 2023-10-05 DIAGNOSIS — E538 Deficiency of other specified B group vitamins: Secondary | ICD-10-CM

## 2023-10-05 DIAGNOSIS — R7989 Other specified abnormal findings of blood chemistry: Secondary | ICD-10-CM

## 2023-10-05 NOTE — Assessment & Plan Note (Signed)
 Patient has mild vitamin B12 deficiency.  - Continue taking oral vitamin B12 1000 mcg every day

## 2023-10-05 NOTE — Assessment & Plan Note (Signed)
 Patient has iron deficiency without anemia likely secondary to heavy menstrual bleeding S/p IV iron with significant improvement in iron labs  - Continue oral iron every other day - Continue to eat high-protein and green leafy vegetables  Return to clinic in 3 months with labs

## 2023-10-05 NOTE — Progress Notes (Signed)
 [facility] at [facility]  HEMATOLOGY FOLLOW-UP VISIT  Russo, Mary Sabin, MD  REASON FOR FOLLOW-UP: Iron deficiency and elevated d-dimer  ASSESSMENT & PLAN:  Patient is a 18 y.o. female following for Iron deficiency and elevated d-dimer.  Iron deficiency Patient has iron deficiency without anemia likely secondary to heavy menstrual bleeding S/p IV iron with significant improvement in iron labs  - Continue oral iron every other day - Continue to eat high-protein and green leafy vegetables  Return to clinic in 3 months with labs  Elevated d-dimer High d-dimer of unclear etiology initially noted 0.89-->1.18-->0.67.  Likely secondary to iron deficiency. Imaging performed thus far includes negative: CT angio negative for PE, upper/lower extremity duplex negative. Renal function normal. CBC normal.  Beta-hCG: Negative.  Home pregnancy test negative. Workup for DIC: PT/INR, PTT: Normal ANA, RF, anti-CCP antibodies: Negative Patient has no family history of cancer or increased risk of cancer in close family. No family history of autoimmune disorders.  Patient does report some joint pains ankle, knee, small joints of feet and hands.  - Levels trending down now -Discussed with patient that her menorrhagia and iron deficiency could have contributed to activation of coagulation system causing slight elevation in D-dimer.   - Discussed in detail that D-dimer is a very nonspecific test and significant elevations are usually seen in cancers.  This mild elevations are likely secondary to iron deficiency or menorrhagia by itself  Return to clinic in 3 months with labs.  Patient would like to have D-dimer rechecked  Vitamin B12 deficiency Patient has mild vitamin B12 deficiency.  - Continue taking oral vitamin B12 1000 mcg every day   Orders Placed This Encounter  Procedures   Ferritin    Standing Status:   Future    Expected Date:   12/31/2023    Expiration  Date:   10/04/2024   Folate    Standing Status:   Future    Expected Date:   12/31/2023    Expiration Date:   10/04/2024   Vitamin B12    Standing Status:   Future    Expected Date:   12/31/2023    Expiration Date:   10/04/2024   Comprehensive metabolic panel with GFR    Standing Status:   Future    Expected Date:   12/31/2023    Expiration Date:   10/04/2024   CBC with Differential/Platelet    Standing Status:   Future    Expected Date:   12/31/2023    Expiration Date:   10/04/2024   Iron and TIBC    Standing Status:   Future    Expected Date:   12/31/2023    Expiration Date:   10/04/2024   D-dimer, quantitative    Standing Status:   Future    Expected Date:   12/31/2023    Expiration Date:   10/04/2024    The total time spent in the appointment was 20 minutes encounter with patients including review of chart and various tests results, discussions about plan of care and coordination of care plan   All questions were answered. The patient knows to call the clinic with any problems, questions or concerns. No barriers to learning was detected.  Eduardo Grade, MD 5/9/202512:11 PM   SUMMARY OF HEMATOLOGIC HISTORY: Iron deficiency:  -Likely secondary to menorrhagia  - S/p IV Feraheme  510 mg into 2 doses: On 08/31/2023 and 09/07/2018  Elevated D-dimer:  -Likely secondary to iron deficiency  -Imaging performed thus  far includes negative: CT angio negative for PE, upper/lower extremity duplex negative. -Renal function normal. CBC normal.  Beta-hCG: Negative.  Home pregnancy test negative. -Workup for DIC: PT/INR, PTT: Normal -Patient has no family history of cancer or increased risk of cancer. -No family history of autoimmune disorders.  Patient does report some joint pains ankle, knee, small joints of feet and hands.   - ANA, RF, anti-CCP: Negative - Repeat beta-hCG: Negative   INTERVAL HISTORY: Mary Russo 18 y.o. female following for iron deficiency and elevated D-dimer.  She experiences  fluctuating energy levels, with periods of increased energy allowing her to engage in activities such as rearranging her kitchen, but she still has days with less energy. She notes an improvement compared to before, stating she does not feel tired at all.  She continues to experience heavy menstrual bleeding, which began after the birth of her daughter. She has consulted her OB-GYN, who plans to perform an abdominal ultrasound on May 29 to investigate further.  She is currently taking iron pills every other day. While the iron pills do not cause constipation, they upset her stomach. Her legs were previously hurting badly, particularly in the hips, but the pain has improved significantly. Occasionally, she still experiences aching in her legs.   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  MEDICATIONS:  Current Outpatient Medications  Medication Sig Dispense Refill   cetirizine  (ZYRTEC  ALLERGY) 10 MG tablet Take 1 tablet (10 mg total) by mouth daily. 90 tablet 1   citalopram  (CELEXA ) 20 MG tablet Take 20 mg by mouth daily.     cyanocobalamin  (VITAMIN B12) 1000 MCG tablet Take 1 tablet (1,000 mcg total) by mouth daily. 90 tablet 2   diltiazem  (CARDIZEM ) 120 MG tablet Take 1 tablet (120 mg total) by mouth 4 (four) times daily. 90 tablet 3   ferrous sulfate  325 (65 FE) MG EC tablet Take 1 tablet (325 mg total) by mouth every other day. 45 tablet 3   ondansetron  (ZOFRAN -ODT) 8 MG disintegrating tablet Take 8 mg by mouth every 8 (eight) hours as needed.     pantoprazole  (PROTONIX ) 40 MG tablet Take 1 tablet (40 mg total) by mouth daily. 30 tablet 3   phenazopyridine (PYRIDIUM) 200 MG tablet Take 200 mg by mouth 3 (three) times daily.     sulfamethoxazole -trimethoprim  (BACTRIM ) 400-80 MG tablet Take 1 tablet by mouth 2 (two) times daily.     No current  facility-administered medications for this visit.     REVIEW OF SYSTEMS:   Constitutional: Denies fevers, chills or night sweats Eyes: Denies blurriness of vision Ears, nose, mouth, throat, and face: Denies mucositis or sore throat Respiratory: Denies cough, dyspnea or wheezes Cardiovascular: Denies palpitation, chest discomfort or lower extremity swelling Gastrointestinal:  Denies nausea, heartburn or change in bowel habits Skin: Denies abnormal skin rashes Lymphatics: Denies new lymphadenopathy or easy bruising Neurological:Denies numbness, tingling or new weaknesses Behavioral/Psych: Mood is stable, no new changes  All other systems were reviewed with the patient and are negative.  PHYSICAL EXAMINATION:  Today's Vitals   10/05/23 0927  BP: 105/68  Pulse: 84  Resp: 16  Temp: 97.7 F (36.5 C)  TempSrc: Oral  SpO2: 100%  Weight: 165 lb 12.8 oz (75.2 kg)  PainSc: 0-No pain    Body mass index is 22.49 kg/m.   GENERAL:alert, no distress and comfortable SKIN:  skin color, texture, turgor are normal, no rashes or significant lesions LYMPH:  no palpable lymphadenopathy in the cervical, axillary or inguinal LUNGS: clear to auscultation and percussion with normal breathing effort HEART: regular rate & rhythm and no murmurs and no lower extremity edema ABDOMEN:abdomen soft, non-tender and normal bowel sounds Musculoskeletal:no cyanosis of digits and no clubbing  NEURO: alert & oriented x 3 with fluent speech  LABORATORY DATA:  I have reviewed the data as listed  Lab Results  Component Value Date   WBC 5.8 09/28/2023   NEUTROABS 3.3 09/28/2023   HGB 13.2 09/28/2023   HCT 38.9 09/28/2023   MCV 90.7 09/28/2023   PLT 342 09/28/2023       Chemistry      Component Value Date/Time   NA 137 09/28/2023 0833   NA 140 08/02/2023 1626   K 3.6 09/28/2023 0833   CL 104 09/28/2023 0833   CO2 24 09/28/2023 0833   BUN 7 09/28/2023 0833   BUN 6 08/02/2023 1626   CREATININE 0.65  09/28/2023 0833   CREATININE 0.75 01/06/2019 1357      Component Value Date/Time   CALCIUM  9.7 09/28/2023 0833   ALKPHOS 63 09/28/2023 0833   AST 15 09/28/2023 0833   ALT 16 09/28/2023 0833   BILITOT 0.5 09/28/2023 0833   BILITOT 0.3 08/02/2023 1626      Latest Reference Range & Units 01/06/19 13:57 11/02/22 14:44 08/29/23 10:41 09/28/23 08:33  Iron 28 - 170 ug/dL  68 50 045  UIBC ug/dL  409 811 914  TIBC 782 - 450 ug/dL  956 213 086  Saturation Ratios 10.4 - 31.8 %   12 32 (H)  Ferritin 11 - 307 ng/mL 14 11 (L) 5 (L) 114  Iron Saturation 15 - 55 %  19    Folate >5.9 ng/mL  6.0 6.4 6.2  Alkaline phosphatase (APISO) 45 - 150 U/L 78     Vitamin D , 25-Hydroxy 30.0 - 100.0 ng/mL 48 33.2    Vitamin B12 180 - 914 pg/mL  268 217 481  (H): Data is abnormally high (L): Data is abnormally low    Latest Reference Range & Units 08/29/23 10:40  Coagulation Factor VIII 56 - 140 % 137  Von Willebrand Antigen, Plasma 50 - 200 % 84  D-Dimer, Quant 0.00 - 0.50 ug/mL-FEU 1.18 (H)  Prothrombin Time 11.4 - 15.2 seconds 13.5  INR 0.8 - 1.2  1.0  APTT 22.9 - 30.2 sec 24.8  (H): Data is abnormally high  COAGULATION:  VON WILLEBRAND FACTOR ASSESSMENT CURRENT RESULTS ASSESSMENT : The VWF:Ag is normal. The VHQ:IONGEXBM is normal. The FVIII  is normal.    Latest Reference Range & Units 08/29/23 10:40  Ristocetin Co-factor, Plasma 50 - 200 % 87    Latest Reference Range & Units 08/31/23 09:40  HCG, Beta Chain, Quant, S <5 mIU/mL <1  ANA Ab, IFA  Negative  CCP Antibodies IgG/IgA 0 - 19 units 8  RA Latex Turbid. <14.0 IU/mL <10.0      Latest Reference Range & Units 08/16/23 17:13 08/29/23 10:40 09/28/23 08:33  D-Dimer, Quant 0.00 - 0.50 ug/mL-FEU 0.89 (H) 1.18 (H) 0.67 (H)  (H): Data is abnormally high

## 2023-10-05 NOTE — Patient Instructions (Signed)
 VISIT SUMMARY:  Today, we discussed your ongoing issues with iron deficiency and heavy menstrual bleeding. You mentioned that your energy levels have been fluctuating, but overall, you feel better than before. We also reviewed your current treatment plan and upcoming tests.  YOUR PLAN:  -HEAVY MENSTRUAL BLEEDING: Heavy menstrual bleeding is when you have unusually heavy or prolonged periods. You are scheduled for an abdominal ultrasound on May 29 to investigate further.   -IRON DEFICIENCY: Iron deficiency occurs when your body doesn't have enough iron, often leading to fatigue and weakness. Your ferritin levels have improved, and you should continue taking iron pills every other day. We will check your iron levels again in three months. If heavy bleeding continues, we may consider iron infusions every three months.  -VITAMIN B12 DEFICIENCY: Vitamin B12 deficiency can cause fatigue and weakness, but your levels are currently adequate. You should continue with your vitamin B12 supplementation.  INSTRUCTIONS:  Please proceed with your abdominal ultrasound on May 29. Continue taking your iron pills every other day and your vitamin B12 supplements. We will recheck your iron levels in three months. Discuss potential treatment options for heavy menstrual bleeding with your OB-GYN, considering your preferences for non-hormonal treatments.

## 2023-10-05 NOTE — Assessment & Plan Note (Signed)
 High d-dimer of unclear etiology initially noted 0.89-->1.18-->0.67.  Likely secondary to iron deficiency. Imaging performed thus far includes negative: CT angio negative for PE, upper/lower extremity duplex negative. Renal function normal. CBC normal.  Beta-hCG: Negative.  Home pregnancy test negative. Workup for DIC: PT/INR, PTT: Normal ANA, RF, anti-CCP antibodies: Negative Patient has no family history of cancer or increased risk of cancer in close family. No family history of autoimmune disorders.  Patient does report some joint pains ankle, knee, small joints of feet and hands.  - Levels trending down now -Discussed with patient that her menorrhagia and iron deficiency could have contributed to activation of coagulation system causing slight elevation in D-dimer.   - Discussed in detail that D-dimer is a very nonspecific test and significant elevations are usually seen in cancers.  This mild elevations are likely secondary to iron deficiency or menorrhagia by itself  Return to clinic in 3 months with labs.  Patient would like to have D-dimer rechecked

## 2023-10-09 DIAGNOSIS — R350 Frequency of micturition: Secondary | ICD-10-CM | POA: Diagnosis not present

## 2023-10-16 ENCOUNTER — Encounter: Payer: Self-pay | Admitting: Gastroenterology

## 2023-11-06 DIAGNOSIS — J00 Acute nasopharyngitis [common cold]: Secondary | ICD-10-CM | POA: Diagnosis not present

## 2023-11-06 DIAGNOSIS — R300 Dysuria: Secondary | ICD-10-CM | POA: Diagnosis not present

## 2023-11-23 DIAGNOSIS — N760 Acute vaginitis: Secondary | ICD-10-CM | POA: Diagnosis not present

## 2023-11-26 DIAGNOSIS — S92511A Displaced fracture of proximal phalanx of right lesser toe(s), initial encounter for closed fracture: Secondary | ICD-10-CM | POA: Diagnosis not present

## 2023-11-27 DIAGNOSIS — N939 Abnormal uterine and vaginal bleeding, unspecified: Secondary | ICD-10-CM | POA: Diagnosis not present

## 2023-12-04 ENCOUNTER — Encounter: Payer: Self-pay | Admitting: Gastroenterology

## 2023-12-06 ENCOUNTER — Ambulatory Visit: Admitting: Gastroenterology

## 2023-12-11 ENCOUNTER — Ambulatory Visit: Admitting: Cardiology

## 2023-12-18 ENCOUNTER — Ambulatory Visit (INDEPENDENT_AMBULATORY_CARE_PROVIDER_SITE_OTHER): Admitting: Podiatry

## 2023-12-18 ENCOUNTER — Ambulatory Visit (INDEPENDENT_AMBULATORY_CARE_PROVIDER_SITE_OTHER)

## 2023-12-18 VITALS — Ht 72.0 in | Wt 165.8 lb

## 2023-12-18 DIAGNOSIS — G5731 Lesion of lateral popliteal nerve, right lower limb: Secondary | ICD-10-CM

## 2023-12-18 DIAGNOSIS — S92514A Nondisplaced fracture of proximal phalanx of right lesser toe(s), initial encounter for closed fracture: Secondary | ICD-10-CM | POA: Diagnosis not present

## 2023-12-18 DIAGNOSIS — S92506A Nondisplaced unspecified fracture of unspecified lesser toe(s), initial encounter for closed fracture: Secondary | ICD-10-CM

## 2023-12-18 DIAGNOSIS — M79671 Pain in right foot: Secondary | ICD-10-CM

## 2023-12-18 NOTE — Progress Notes (Signed)
 Subjective:  Patient ID: Mary Russo, female    DOB: Oct 31, 2005,  MRN: [id number]  Chief Complaint  Patient presents with   Toe Injury    RM 2 Patient is here for possible fracture to the fifth toe, injury occurred two weeks ago (patient seen at [facility]). Patient states intermittent pain in the right fifth toe with some swelling in the right ankle.    Discussed the use of AI scribe software for clinical note transcription with the patient, who gave verbal consent to proceed.  History of Present Illness Mary Russo is a 18 year old female who presents with a fractured toe and numbness in her ankle.  She has a fractured right fifth toe after stubbing it on her husband's boots, initially perceived as minor but later noted significant bruising and swelling. She reports that an x-ray at [facility] showed a fracture. She experiences pain when bending the toe and has no history of toe fractures.  She has a history of multiple ankle injuries, including a significant injury four to five years ago while playing basketball, which required surgical intervention with anchors for ligament repair. She continues to experience numbness and a 'pins and needles' sensation in her ankle, particularly when driving or walking, sometimes necessitating stopping to shake her foot to regain sensation.  Her medical history includes frequent ankle fractures and a stress fracture. She notes that her knees turn in due to her height, contributing to ligament issues. She underwent physical therapy for her knees, which have worsened since childbirth, experiencing pain and grinding, indicating possible alignment issues.  She does not currently use a brace but has in the past, especially during physical activities. Improper footwear exacerbates her ankle pain. She has no significant low back issues, though her lower back has bothered her in the past. She experiences tingling in her foot when her  daughter plays and accidentally hits her leg.      Objective:    Physical Exam VASCULAR: DP and PT pulse palpable. Foot is warm and well-perfused. Capillary fill time is brisk. DERMATOLOGIC: Normal skin turgor, texture, and temperature. No open lesions, rashes, or ulcerations. NEUROLOGIC: Sensitivity, numbness, and altered sensation in dorsal cutaneous nerves proximal to the ankle and remote to incsion. No evidence of plantar or tarsal tunnel symptoms. Sensitivity worsens with palpation of common peroneal nerve at fibula and straight leg raise. ORTHOPEDIC: Pain and swelling on the right fifth toe. Little to no pain on the right metatarsal. No bruising or abnormality on the right foot. Well-healed surgical scar on right lateral ankle with slight sensitivity. Good range of motion of the right ankle joint.   No images are attached to the encounter.    Results RADIOLOGY Right foot radiograph: Minimally displaced spiral fracture of the right fifth proximal phalanx, extraarticular (12/18/2023)   Assessment:   1. 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  Assessment and Plan Assessment & Plan Spiral fracture of right fifth toe Minimally displaced spiral fracture of the right fifth proximal phalanx, extraarticular with no displacement. Toe fractures typically cause soreness and swelling for up to three months. No surgical intervention needed as it will heal with time. - Advise wearing comfortable shoes  Numbness and altered sensation in right foot Numbness and altered sensation in the right foot,  particularly in the area of the medial dorsal cutaneous nerves. Symptoms are exacerbated by palpation of the common peroneal nerve at the fibula and with straight leg raise of the right lower extremity.  Differential diagnosis includes nerve compression in the back or common peroneal nerve entrapment. Symptoms may be related to previous ankle surgery but are not directly caused by it. - Refer to Dr. Joshua at [facility] for evaluation of potential nerve entrapment - Consider nerve conduction test or imaging study if recommended by neurosurgeon  Lateral ankle stabilization surgery Previous lateral ankle stabilization surgery on the right ankle. Current symptoms include numbness and altered sensation, likely related to nerve issues rather than the surgery itself. Ankle stability is crucial to prevent long-term complications such as arthritis. Current symptoms do not indicate an immediate need for further surgery, but monitoring and potential future intervention may be necessary. - Advise wearing a lace-up brace during activities on uneven ground - Monitor ankle stability and symptoms - Consider future surgical intervention if symptoms worsen      No follow-ups on file.

## 2023-12-25 ENCOUNTER — Other Ambulatory Visit: Payer: Self-pay | Admitting: *Deleted

## 2023-12-25 ENCOUNTER — Inpatient Hospital Stay: Attending: Hematology

## 2023-12-25 ENCOUNTER — Other Ambulatory Visit

## 2023-12-25 DIAGNOSIS — D500 Iron deficiency anemia secondary to blood loss (chronic): Secondary | ICD-10-CM

## 2023-12-25 DIAGNOSIS — E611 Iron deficiency: Secondary | ICD-10-CM | POA: Insufficient documentation

## 2023-12-25 DIAGNOSIS — R791 Abnormal coagulation profile: Secondary | ICD-10-CM | POA: Insufficient documentation

## 2023-12-25 DIAGNOSIS — E538 Deficiency of other specified B group vitamins: Secondary | ICD-10-CM | POA: Insufficient documentation

## 2023-12-25 LAB — CMP (CANCER CENTER ONLY)
ALT: 14 U/L (ref 0–44)
AST: 12 U/L — ABNORMAL LOW (ref 15–41)
Albumin: 4.6 g/dL (ref 3.5–5.0)
Alkaline Phosphatase: 67 U/L (ref 38–126)
Anion gap: 7 (ref 5–15)
BUN: 8 mg/dL (ref 6–20)
CO2: 26 mmol/L (ref 22–32)
Calcium: 9.5 mg/dL (ref 8.9–10.3)
Chloride: 106 mmol/L (ref 98–111)
Creatinine: 0.77 mg/dL (ref 0.44–1.00)
GFR, Estimated: >60 mL/min (ref >60)
Glucose, Bld: 97 mg/dL (ref 70–99)
Potassium: 3.6 mmol/L (ref 3.5–5.1)
Sodium: 139 mmol/L (ref 135–145)
Total Bilirubin: 0.5 mg/dL (ref 0.0–1.2)
Total Protein: 7.7 g/dL (ref 6.5–8.1)

## 2023-12-25 LAB — CBC WITH DIFFERENTIAL (CANCER CENTER ONLY)
Abs Immature Granulocytes: 0.01 K/uL (ref 0.00–0.07)
Basophils Absolute: 0 K/uL (ref 0.0–0.1)
Basophils Relative: 1 %
Eosinophils Absolute: 0.2 K/uL (ref 0.0–0.5)
Eosinophils Relative: 4 %
HCT: 37.8 % (ref 36.0–46.0)
Hemoglobin: 12.9 g/dL (ref 12.0–15.0)
Immature Granulocytes: 0 %
Lymphocytes Relative: 33 %
Lymphs Abs: 1.9 K/uL (ref 0.7–4.0)
MCH: 30.2 pg (ref 26.0–34.0)
MCHC: 34.1 g/dL (ref 30.0–36.0)
MCV: 88.5 fL (ref 80.0–100.0)
Monocytes Absolute: 0.3 K/uL (ref 0.1–1.0)
Monocytes Relative: 6 %
Neutro Abs: 3.3 K/uL (ref 1.7–7.7)
Neutrophils Relative %: 56 %
Platelet Count: 352 K/uL (ref 150–400)
RBC: 4.27 MIL/uL (ref 3.87–5.11)
RDW: 12.3 % (ref 11.5–15.5)
WBC Count: 5.8 K/uL (ref 4.0–10.5)
nRBC: 0 % (ref 0.0–0.2)

## 2023-12-25 LAB — IRON AND IRON BINDING CAPACITY (CC-WL,HP ONLY)
Iron: 112 ug/dL (ref 28–170)
Saturation Ratios: 37 % — ABNORMAL HIGH (ref 10.4–31.8)
TIBC: 301 ug/dL (ref 250–450)
UIBC: 189 ug/dL (ref 148–442)

## 2023-12-25 LAB — D-DIMER, QUANTITATIVE: D-Dimer, Quant: 0.49 ug{FEU}/mL (ref 0.00–0.50)

## 2023-12-25 LAB — VITAMIN B12: Vitamin B-12: 242 pg/mL (ref 180–914)

## 2023-12-25 LAB — FERRITIN: Ferritin: 82 ng/mL (ref 11–307)

## 2023-12-25 LAB — FOLATE: Folate: 6.8 ng/mL (ref >5.9)

## 2023-12-28 ENCOUNTER — Inpatient Hospital Stay

## 2024-01-01 ENCOUNTER — Other Ambulatory Visit: Payer: Self-pay | Admitting: Medical Genetics

## 2024-01-04 ENCOUNTER — Inpatient Hospital Stay: Attending: Oncology | Admitting: Oncology

## 2024-01-04 VITALS — BP 108/71 | HR 90 | Temp 98.1°F | Resp 18

## 2024-01-04 DIAGNOSIS — R7989 Other specified abnormal findings of blood chemistry: Secondary | ICD-10-CM

## 2024-01-04 DIAGNOSIS — E611 Iron deficiency: Secondary | ICD-10-CM | POA: Insufficient documentation

## 2024-01-04 DIAGNOSIS — E538 Deficiency of other specified B group vitamins: Secondary | ICD-10-CM | POA: Diagnosis not present

## 2024-01-04 DIAGNOSIS — N920 Excessive and frequent menstruation with regular cycle: Secondary | ICD-10-CM | POA: Diagnosis not present

## 2024-01-04 DIAGNOSIS — R791 Abnormal coagulation profile: Secondary | ICD-10-CM | POA: Insufficient documentation

## 2024-01-04 NOTE — Progress Notes (Signed)
 [facility]  HEMATOLOGY FOLLOW-UP VISIT  [name], [name] LABOR, MD  REASON FOR FOLLOW-UP: Iron deficiency and elevated d-dimer  ASSESSMENT & PLAN:  Patient is a 18 y.o. female following for Iron deficiency and elevated d-dimer.  Assessment & Plan Iron deficiency Patient has iron deficiency without anemia likely secondary to heavy menstrual bleeding S/p IV iron with significant improvement in iron labs Patient was started on a contraceptive patch for her menorrhagia and she is supposed to start it after 2 cycles.  - Can discontinue iron at this time - Continue to eat high-protein and green leafy vegetables - Continue to follow-up with GYN  Return to clinic in 6 months with labs Elevated d-dimer High d-dimer of unclear etiology initially noted 0.89-->1.18-->0.67.  Likely secondary to iron deficiency. Imaging performed thus far includes negative: CT angio negative for PE, upper/lower extremity duplex negative. Renal function normal. CBC normal.  Beta-hCG: Negative.  Home pregnancy test negative. Workup for DIC: PT/INR, PTT: Normal ANA, RF, anti-CCP antibodies: Negative Patient has no family history of cancer or increased risk of cancer in close family. No family history of autoimmune disorders.  Patient does report some joint pains ankle, knee, small joints of feet and hands.  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  - Continue taking oral vitamin B12 1000 mcg every day   Orders Placed This Encounter  Procedures   Ferritin    Standing Status:   Future    Expected Date:   06/30/2024    Expiration Date:   09/28/2024   Vitamin B12    Standing Status:   Future    Expected Date:   06/30/2024    Expiration Date:   09/28/2024   Comprehensive metabolic panel with GFR    Standing Status:   Future    Expected Date:   06/30/2024    Expiration Date:   09/28/2024   CBC with  Differential/Platelet    Standing Status:   Future    Expected Date:   06/30/2024    Expiration Date:   09/28/2024   Iron and TIBC    Standing Status:   Future    Expected Date:   06/30/2024    Expiration Date:   09/28/2024    The total time spent in the appointment was 20 minutes encounter with patients including review of chart and various tests results, discussions about plan of care and coordination of care plan   All questions were answered. The patient knows to call the clinic with any problems, questions or concerns. No barriers to learning was detected.  Mickiel Dry, MD 8/8/202510:00 AM   SUMMARY OF HEMATOLOGIC HISTORY: Iron deficiency:  -Likely secondary to menorrhagia  - S/p IV Feraheme  510 mg into 2 doses: On 08/31/2023 and 09/07/2018  Elevated D-dimer:  -Likely secondary to iron deficiency  -Imaging performed thus far includes negative: CT angio negative for PE, upper/lower extremity duplex negative. -Renal function normal. CBC normal.  Beta-hCG: Negative.  Home pregnancy test negative. -Workup for DIC: PT/INR, PTT: Normal -Patient has no family history of cancer or increased risk of cancer. -No family history of autoimmune disorders.  Patient does report some joint pains ankle, knee, small joints of feet and hands.   - ANA, RF, anti-CCP: Negative - Repeat beta-hCG: Negative - Resolved now   INTERVAL HISTORY: Mary Russo 18 y.o. female following for iron deficiency and elevated D-dimer.  She is accompanied by  her mother today.  Since IV iron infusion, patient has been significantly feeling better with high energy levels.  She is excited to go to school.  She has no other complaints today and has been doing well.  She was started on a contraceptive patch by her GYN but she is not supposed to start it until after 2 more cycles.  Reportedly has thickened endometrium.  I have reviewed the past medical history, past surgical history, social history and family history with  the patient   ALLERGIES:  is allergic to peanut-containing drug products.  MEDICATIONS:  Current Outpatient Medications  Medication Sig Dispense Refill   cetirizine  (ZYRTEC  ALLERGY) 10 MG tablet Take 1 tablet (10 mg total) by mouth daily. 90 tablet 1   citalopram  (CELEXA ) 20 MG tablet Take 20 mg by mouth daily.     cyanocobalamin  (VITAMIN B12) 1000 MCG tablet Take 1 tablet (1,000 mcg total) by mouth daily. 90 tablet 2   diltiazem  (CARDIZEM ) 120 MG tablet Take 1 tablet (120 mg total) by mouth 4 (four) times daily. 90 tablet 3   ferrous sulfate  325 (65 FE) MG EC tablet Take 1 tablet (325 mg total) by mouth every other day. 45 tablet 3   pantoprazole  (PROTONIX ) 40 MG tablet Take 1 tablet (40 mg total) by mouth daily. 30 tablet 3   No current facility-administered medications for this visit.     REVIEW OF SYSTEMS:   Constitutional: Denies fevers, chills or night sweats Eyes: Denies blurriness of vision Ears, nose, mouth, throat, and face: Denies mucositis or sore throat Respiratory: Denies cough, dyspnea or wheezes Cardiovascular: Denies palpitation, chest discomfort or lower extremity swelling Gastrointestinal:  Denies nausea, heartburn or change in bowel habits Skin: Denies abnormal skin rashes Lymphatics: Denies new lymphadenopathy or easy bruising Neurological:Denies numbness, tingling or new weaknesses Behavioral/Psych: Mood is stable, no new changes  All other systems were reviewed with the patient and are negative.  PHYSICAL EXAMINATION:  Today's Vitals   01/04/24 0907  BP: 108/71  Pulse: 90  Resp: 18  Temp: 98.1 F (36.7 C)  TempSrc: Oral  SpO2: 100%  PainSc: 0-No pain     There is no height or weight on file to calculate BMI.   GENERAL:alert, no distress and comfortable SKIN: skin color, texture, turgor are normal, no rashes or significant lesions LYMPH:  no palpable lymphadenopathy in the cervical, axillary or inguinal LUNGS: clear to auscultation and  percussion with normal breathing effort HEART: regular rate & rhythm and no murmurs and no lower extremity edema ABDOMEN:abdomen soft, non-tender and normal bowel sounds Musculoskeletal:no cyanosis of digits and no clubbing  NEURO: alert & oriented x 3 with fluent speech  LABORATORY DATA:  I have reviewed the data as listed  Lab Results  Component Value Date   WBC 5.8 12/25/2023   NEUTROABS 3.3 12/25/2023   HGB 12.9 12/25/2023   HCT 37.8 12/25/2023   MCV 88.5 12/25/2023   PLT 352 12/25/2023       Chemistry      Component Value Date/Time   NA 139 12/25/2023 1136   NA 140 08/02/2023 1626   K 3.6 12/25/2023 1136   CL 106 12/25/2023 1136   CO2 26 12/25/2023 1136   BUN 8 12/25/2023 1136   BUN 6 08/02/2023 1626   CREATININE 0.77 12/25/2023 1136   CREATININE 0.75 01/06/2019 1357      Component Value Date/Time   CALCIUM  9.5 12/25/2023 1136   ALKPHOS 67 12/25/2023 1136   AST 12 (  L) 12/25/2023 1136   ALT 14 12/25/2023 1136   BILITOT 0.5 12/25/2023 1136      Latest Reference Range & Units 12/25/23 11:36 12/25/23 11:38 12/25/23 11:39  Iron 28 - 170 ug/dL 887    UIBC 851 - 557 ug/dL 810    TIBC 749 - 549 ug/dL 698    Saturation Ratios 10.4 - 31.8 % 37 (H)    Ferritin 11 - 307 ng/mL   82  Folate >5.9 ng/mL  6.8   Vitamin B12 180 - 914 pg/mL  242   (H): Data is abnormally high    Latest Reference Range & Units 08/29/23 10:40  Coagulation Factor VIII 56 - 140 % 137  Von Willebrand Antigen, Plasma 50 - 200 % 84  D-Dimer, Quant 0.00 - 0.50 ug/mL-FEU 1.18 (H)  Prothrombin Time 11.4 - 15.2 seconds 13.5  INR 0.8 - 1.2  1.0  APTT 22.9 - 30.2 sec 24.8  (H): Data is abnormally high   Latest Reference Range & Units 08/16/23 17:13 08/29/23 10:40 09/28/23 08:33 12/25/23 11:38  D-Dimer, Quant 0.00 - 0.50 ug/mL-FEU 0.89 (H) 1.18 (H) 0.67 (H) 0.49  (H): Data is abnormally high  Von Willebrand factor assessment: The VWF:Ag is normal. The CTQ:Jrupcpub is normal. The FVIII  is  normal.    Latest Reference Range & Units 08/29/23 10:40  Ristocetin Co-factor, Plasma 50 - 200 % 87    Latest Reference Range & Units 08/31/23 09:40  HCG, Beta Chain, Quant, S <5 mIU/mL <1  ANA Ab, IFA  Negative  CCP Antibodies IgG/IgA 0 - 19 units 8  RA Latex Turbid. <14.0 IU/mL <10.0

## 2024-01-04 NOTE — Assessment & Plan Note (Signed)
 Patient has severe iron deficiency likely secondary to heavy menstrual bleeding.  Last iron labs consistent with ferritin of 5 and TSAT of 12.  Patient was not anemic on this lab check. -S/p 1 dose of IV Feraheme.  Second dose due today. -Continue taking oral ferrous sulfate every other day.  Use MiraLAX for constipation. -GYN evaluation for menorrhagia  Return to clinic in 4 weeks with labs to assess response to IV iron

## 2024-01-04 NOTE — Assessment & Plan Note (Addendum)
 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  - Continue taking oral vitamin B12 1000 mcg every day

## 2024-01-04 NOTE — Assessment & Plan Note (Addendum)
 Patient has iron deficiency without anemia likely secondary to heavy menstrual bleeding S/p IV iron with significant improvement in iron labs Patient was started on a contraceptive patch for her menorrhagia and she is supposed to start it after 2 cycles.  - Can discontinue iron at this time - Continue to eat high-protein and green leafy vegetables - Continue to follow-up with GYN  Return to clinic in 6 months with labs

## 2024-01-04 NOTE — Assessment & Plan Note (Addendum)
 High d-dimer of unclear etiology initially noted 0.89-->1.18-->0.67.  Likely secondary to iron deficiency. Imaging performed thus far includes negative: CT angio negative for PE, upper/lower extremity duplex negative. Renal function normal. CBC normal.  Beta-hCG: Negative.  Home pregnancy test negative. Workup for DIC: PT/INR, PTT: Normal ANA, RF, anti-CCP antibodies: Negative Patient has no family history of cancer or increased risk of cancer in close family. No family history of autoimmune disorders.  Patient does report some joint pains ankle, knee, small joints of feet and hands.  -Resolved at this time

## 2024-01-25 ENCOUNTER — Ambulatory Visit: Admitting: Gastroenterology

## 2024-01-25 ENCOUNTER — Other Ambulatory Visit (HOSPITAL_COMMUNITY)
Admission: RE | Admit: 2024-01-25 | Discharge: 2024-01-25 | Disposition: A | Discharge status: Home / Self Care | Payer: Self-pay | Source: Ambulatory Visit | Attending: Oncology | Admitting: Oncology

## 2024-01-25 NOTE — Progress Notes (Deleted)
 Chief Complaint:GERD, iron deficiency anemia Primary GI Doctor:***  HPI:  Patient is a  18  year old female patient with past medical history of PFO, GERD, IDA, B 12 deficiency*****who was referred to me by [name], [name] LABOR, MD on 09/27/23 for a evaluation of GERD, iron deficiency anemia .    08/22/23 seen by cardiology for worsening symptoms of tachycardiac, SOB, and fatigue. She has a history of abnormal EKGs and elevated D-dimer levels, but a recent CT angiogram was normal. She was previously on metoprolol , but stopped due to side effects. Referred to hematology for evaluation of elevated D dimer. Reviewed entire note.  01/04/24 seen by oncology/hematology   Interval History  Patient presents for evaluation of GERD and iron deficiency  Patient has history of heavy menstrual cycle Patient was started on a contraceptive patch for her menorrhagia and she is supposed to start it after 2 cycles  Patient taking oral iron 1 atb po daily  Patient admits/denies GERD Patient started on Protonix  40 mg po daily Patient admits/denies dysphagia Patient admits/denies nausea, vomiting, or weight loss  Patient admits/denies altered bowel habits Patient admits/denies abdominal pain Patient admits/denies rectal bleeding   Denies/Admits alcohol Denies/Admits smoking Denies/Admits NSAID use. Denies/Admits they are on blood thinners.  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 90 tablet 1   citalopram  (CELEXA ) 20 MG tablet Take 20 mg by mouth daily.     cyanocobalamin  (VITAMIN B12) 1000 MCG tablet Take 1 tablet (1,000 mcg total) by mouth daily. 90 tablet 2   diltiazem  (CARDIZEM ) 120 MG tablet Take 1 tablet (120 mg total) by mouth 4 (four) times daily. 90 tablet 3   ferrous sulfate  325 (65 FE) MG EC tablet Take 1 tablet (325 mg total) by mouth every other day. 45 tablet 3   pantoprazole  (PROTONIX ) 40 MG tablet Take 1 tablet (40 mg total) by mouth daily. 30 tablet 3   No current facility-administered medications for this visit.    Allergies as of 01/25/2024 - Review Complete 01/04/2024  Allergen Reaction Noted   Peanut-containing drug products Rash 12/07/2021    Family History  Problem Relation Age of Onset   Asthma Mother    COPD Mother    Kidney disease Mother    Hyperlipidemia Father    Mitral valve prolapse Father    Heart disease Father    Congestive Heart Failure Father    Learning disabilities Brother  Heart disease Maternal Grandmother    COPD Maternal Grandfather    Heart disease Maternal Grandfather    Lung cancer Paternal Grandfather     Review of Systems:    Constitutional: No weight loss, fever, chills, weakness or fatigue HEENT: Eyes: No change in vision               Ears, Nose, Throat:  No change in hearing or congestion Skin: No rash or itching Cardiovascular: No chest pain, chest pressure or palpitations   Respiratory: No SOB or cough Gastrointestinal: See HPI and otherwise  negative Genitourinary: No dysuria or change in urinary frequency Neurological: No headache, dizziness or syncope Musculoskeletal: No new muscle or joint pain Hematologic: No bleeding or bruising Psychiatric: No history of depression or anxiety    Physical Exam:  Vital signs: There were no vitals taken for this visit.  Constitutional:   Pleasant *** female/female appears to be in NAD, Well developed, Well nourished, alert and cooperative Eyes:   PEERL, EOMI. No icterus. Conjunctiva pink. Neck:  Supple Throat: Oral cavity and pharynx without inflammation, swelling or lesion.  Respiratory: Respirations even and unlabored. Lungs clear to auscultation bilaterally.   No wheezes, crackles, or rhonchi.  Cardiovascular: Normal S1, S2. Regular rate and rhythm. No peripheral edema, cyanosis or pallor.  Gastrointestinal:  Soft, nondistended, nontender. No rebound or guarding. Normal bowel sounds. No appreciable masses or hepatomegaly. Rectal:  Not performed.  Anoscopy: Msk:  Symmetrical without gross deformities. Without edema, no deformity or joint abnormality.  Neurologic:  Alert and  oriented x4;  grossly normal neurologically.  Skin:   Dry and intact without significant lesions or rashes.  RELEVANT LABS AND IMAGING: CBC    Latest Ref Rng & Units 12/25/2023   11:36 AM 09/28/2023    8:33 AM 08/29/2023   10:40 AM  CBC  WBC 4.0 - 10.5 K/uL 5.8  5.8  5.3   Hemoglobin 12.0 - 15.0 g/dL 87.0  86.7  87.4   Hematocrit 36.0 - 46.0 % 37.8  38.9  38.3   Platelets 150 - 400 K/uL 352  342  360      CMP     Latest Ref Rng & Units 12/25/2023   11:36 AM 09/28/2023    8:33 AM 08/29/2023   10:40 AM  CMP  Glucose 70 - 99 mg/dL 97  897  95   BUN 6 - 20 mg/dL 8  7  5    Creatinine 0.44 - 1.00 mg/dL 9.22  9.34  9.30   Sodium 135 - 145 mmol/L 139  137  137   Potassium 3.5 - 5.1 mmol/L 3.6  3.6  3.6   Chloride 98 - 111 mmol/L 106  104  103   CO2 22 - 32 mmol/L 26  24  22    Calcium  8.9 - 10.3 mg/dL 9.5  9.7   9.5   Total Protein 6.5 - 8.1 g/dL 7.7  7.8  7.6   Total Bilirubin 0.0 - 1.2 mg/dL 0.5  0.5  0.4   Alkaline Phos 38 - 126 U/L 67  63  70   AST 15 - 41 U/L 12  15  16    ALT 0 - 44 U/L 14  16  15       Lab Results  Component Value Date   TSH 3.093 08/16/2023   Lab Results  Component Value Date   IRON 112 12/25/2023   TIBC 301 12/25/2023   FERRITIN 82 12/25/2023  12/25/23 labs show: folate 6.8,  b 12 242 08/2023 labs show: ANA negative, RF factor negative  09/19/23 echo- EF 55-65%   Assessment: Iron deficiency anemia Hgb 12.9  -IV iron with hematology B 12 deficiency -Continue taking oral vitamin B12 1000 mcg every day   Elevated d-dimer High d-dimer of unclear etiology initially noted 0.89-->1.18-->0.67.  Likely secondary to iron deficiency. Imaging performed thus far includes negative: CT angio negative for PE, upper/lower extremity duplex negative. Renal function normal. CBC normal.  Beta-hCG: Negative.  Home pregnancy test negative. Workup for DIC: PT/INR, PTT: Normal ANA, RF, anti-CCP antibodies: Negative Patient has no family history of cancer or increased risk of cancer in close family. No family history of autoimmune disorders.   Patent Foramen Ovale (PFO)  Closure not indicated unless associated with migraines or strokes.  Plan: 1. ***   Thank you for the courtesy of this consult. Please call me with any questions or concerns.   Keven Soucy, FNP-C [facility] 01/25/2024, 7:08 AM  Cc: [name], [name] LABOR, MD

## 2024-01-31 ENCOUNTER — Encounter: Admitting: Family Medicine

## 2024-02-03 LAB — GENECONNECT MOLECULAR SCREEN: Genetic Analysis Overall Interpretation: NEGATIVE

## 2024-02-04 DIAGNOSIS — G5621 Lesion of ulnar nerve, right upper limb: Secondary | ICD-10-CM | POA: Diagnosis not present

## 2024-02-07 ENCOUNTER — Ambulatory Visit: Attending: Cardiology | Admitting: Cardiology

## 2024-02-28 ENCOUNTER — Telehealth: Payer: Self-pay | Admitting: Family Medicine

## 2024-02-28 NOTE — Telephone Encounter (Unsigned)
 Copied from CRM #[id number]. Topic: General - Other >> Feb 28, 2024 10:15 AM Sophia H wrote: Reason for CRM: Patient is wanting to know if she can get a note for work regarding why she can't get her flu shot - states she believes she is allergic to it. Last time she had it she broke out with blisters in her throat and also had itchy throat for a few days after and does not want to have to go through this again. Also happened before when she was in college. Please advise # [phone]

## 2024-02-28 NOTE — Telephone Encounter (Signed)
 Letter made. I called patient and made her aware. Its in the drawer up front. She will pick it up tomorrow morning.

## 2024-02-28 NOTE — Telephone Encounter (Signed)
 I am fine to go ahead and do a note for her saying that she believes that she had a reaction with blisters in her throat the last time she had the vaccine, excusing her from it.

## 2024-03-02 ENCOUNTER — Encounter: Payer: Self-pay | Admitting: Oncology

## 2024-03-03 ENCOUNTER — Other Ambulatory Visit: Payer: Self-pay | Admitting: *Deleted

## 2024-03-03 DIAGNOSIS — E611 Iron deficiency: Secondary | ICD-10-CM

## 2024-03-05 ENCOUNTER — Inpatient Hospital Stay: Attending: Oncology

## 2024-03-24 ENCOUNTER — Ambulatory Visit: Admitting: Gastroenterology

## 2024-03-24 NOTE — Progress Notes (Deleted)
 Chief Complaint:GERD, iron deficiency anemia Primary GI Doctor:***  HPI:  Patient is a  18  year old female patient with past medical history of iron deficiency anemia, mild b 12 deficiency, hx of PFO *****who was referred to me by [name], [name] LABOR, MD on 09/27/23 for a evaluation of GERD, iron deficiency anemia .   07/2023 seen by cardiology for atypical chest pain. Started on cardizem . Ordered echo.  01/04/24 seen by oncology for IDA and elevated d dimer. CT angio negative for PE, upper/lower extremity duplex negative. Renal function normal. CBC normal.  Beta-hCG: Negative.  Home pregnancy test negative. Workup for DIC: PT/INR, PTT: Normal ANA, RF, anti-CCP antibodies: Negative  Interval History  Patient admits/denies GERD Patient taking pantoprazole  40 mg po daily Patient admits/denies dysphagia Patient admits/denies nausea, vomiting, or weight loss  Patient taking ferrous sulfate  1 tablet po daily Patient received Iv iron infusions  Patient taking vitamin b 12  Patient admits/denies altered bowel habits Patient admits/denies abdominal pain Patient admits/denies rectal bleeding   Denies/Admits alcohol Denies/Admits smoking Denies/Admits NSAID use. Denies/Admits they are on blood thinners.  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 90 tablet 1   citalopram  (CELEXA ) 20 MG tablet Take 20 mg by mouth daily.     cyanocobalamin  (VITAMIN B12) 1000 MCG tablet Take 1 tablet (1,000 mcg total) by mouth daily. 90 tablet 2   diltiazem  (CARDIZEM ) 120 MG tablet Take 1 tablet (120 mg total) by mouth 4 (four) times daily. 90 tablet 3   ferrous sulfate  325 (65 FE) MG EC tablet Take 1 tablet (325 mg total) by mouth every other day. 45 tablet 3   pantoprazole  (PROTONIX ) 40 MG tablet Take 1 tablet (40 mg total) by mouth daily. 30 tablet 3   No current facility-administered medications for this visit.    Allergies as of 03/24/2024 - Review Complete 01/04/2024  Allergen Reaction Noted   Peanut-containing drug products Rash 12/07/2021    Family History  Problem Relation Age of Onset   Asthma Mother    COPD Mother    Kidney disease Mother    Hyperlipidemia Father    Mitral valve prolapse Father    Heart disease Father    Congestive Heart Failure Father    Learning disabilities Brother    Heart disease Maternal Grandmother    COPD Maternal Grandfather    Heart disease Maternal Grandfather    Lung cancer Paternal  Grandfather     Review of Systems:    Constitutional: No weight loss, fever, chills, weakness or fatigue HEENT: Eyes: No change in vision               Ears, Nose, Throat:  No change in hearing or congestion Skin: No rash or itching Cardiovascular: No chest pain, chest pressure or palpitations   Respiratory: No SOB or cough Gastrointestinal: See HPI and otherwise negative Genitourinary: No dysuria or change in urinary frequency Neurological: No headache, dizziness or  syncope Musculoskeletal: No new muscle or joint pain Hematologic: No bleeding or bruising Psychiatric: No history of depression or anxiety    Physical Exam:  Vital signs: There were no vitals taken for this visit.  Constitutional:   Pleasant *** female/female appears to be in NAD, Well developed, Well nourished, alert and cooperative Eyes:   PEERL, EOMI. No icterus. Conjunctiva pink. Neck:  Supple Throat: Oral cavity and pharynx without inflammation, swelling or lesion.  Respiratory: Respirations even and unlabored. Lungs clear to auscultation bilaterally.   No wheezes, crackles, or rhonchi.  Cardiovascular: Normal S1, S2. Regular rate and rhythm. No peripheral edema, cyanosis or pallor.  Gastrointestinal:  Soft, nondistended, nontender. No rebound or guarding. Normal bowel sounds. No appreciable masses or hepatomegaly. Rectal:  Not performed.  Anoscopy: Msk:  Symmetrical without gross deformities. Without edema, no deformity or joint abnormality.  Neurologic:  Alert and  oriented x4;  grossly normal neurologically.  Skin:   Dry and intact without significant lesions or rashes.  RELEVANT LABS AND IMAGING: CBC    Latest Ref Rng & Units 12/25/2023   11:36 AM 09/28/2023    8:33 AM 08/29/2023   10:40 AM  CBC  WBC 4.0 - 10.5 K/uL 5.8  5.8  5.3   Hemoglobin 12.0 - 15.0 g/dL 87.0  86.7  87.4   Hematocrit 36.0 - 46.0 % 37.8  38.9  38.3   Platelets 150 - 400 K/uL 352  342  360      CMP     Latest Ref Rng & Units 12/25/2023   11:36 AM 09/28/2023    8:33 AM 08/29/2023   10:40 AM  CMP  Glucose 70 - 99 mg/dL 97  897  95   BUN 6 - 20 mg/dL 8  7  5    Creatinine 0.44 - 1.00 mg/dL 9.22  9.34  9.30   Sodium 135 - 145 mmol/L 139  137  137   Potassium 3.5 - 5.1 mmol/L 3.6  3.6  3.6   Chloride 98 - 111 mmol/L 106  104  103   CO2 22 - 32 mmol/L 26  24  22    Calcium  8.9 - 10.3 mg/dL 9.5  9.7  9.5   Total Protein 6.5 - 8.1 g/dL 7.7  7.8  7.6   Total Bilirubin 0.0 - 1.2 mg/dL 0.5  0.5  0.4    Alkaline Phos 38 - 126 U/L 67  63  70   AST 15 - 41 U/L 12  15  16    ALT 0 - 44 U/L 14  16  15       Lab Results  Component Value Date   TSH 3.093 08/16/2023   Lab Results  Component Value Date   IRON 112 12/25/2023   TIBC 301 12/25/2023   FERRITIN 82 12/25/2023    08/17/23 CT angio IMPRESSION: Negative examination for pulmonary embolism. No acute CT findings of the chest.   07/2023 echo- The baseline ejection fraction was 55%.  The peak  ejection fraction at stress was 65%.   Assessment: 1. ***  Plan: 1. ***   Thank you for the courtesy of this consult. Please call me with any questions or concerns.   Kamerin Axford, FNP-C [facility] 03/24/2024, 7:21 AM  Cc: [name], [name] LABOR, MD

## 2024-03-25 ENCOUNTER — Telehealth: Payer: Self-pay

## 2024-03-25 NOTE — Telephone Encounter (Unsigned)
 Copied from CRM #[id number]. Topic: Clinical - Medical Advice >> Mar 25, 2024  2:58 PM Harlene ORN wrote: Reason for CRM: Patient is allergic to taking the flu shot. Needs a confirmation from her PCP to give to her Employer that confirms that she is allergic. Please call back the patient to discuss.

## 2024-03-25 NOTE — Telephone Encounter (Signed)
 I called and spoke with patient and she says the note has already been written for her but her job is requiring the note to have a list of the reactions she had with taking the flu vaccine.  Note has been updated and patient is aware.

## 2024-03-27 ENCOUNTER — Encounter: Payer: Self-pay | Admitting: Family Medicine

## 2024-03-27 ENCOUNTER — Telehealth (INDEPENDENT_AMBULATORY_CARE_PROVIDER_SITE_OTHER): Admitting: Family Medicine

## 2024-03-27 ENCOUNTER — Encounter: Payer: Self-pay | Admitting: Oncology

## 2024-03-27 ENCOUNTER — Other Ambulatory Visit: Payer: Self-pay

## 2024-03-27 DIAGNOSIS — R300 Dysuria: Secondary | ICD-10-CM

## 2024-03-27 MED ORDER — SULFAMETHOXAZOLE-TRIMETHOPRIM 800-160 MG PO TABS
1.0000 | ORAL_TABLET | Freq: Two times a day (BID) | ORAL | 0 refills | Status: AC
  Filled 2024-03-27: qty 14, 7d supply, fill #0

## 2024-03-27 NOTE — Progress Notes (Signed)
   Virtual Visit via video Note  I connected with  Mary Russo  on 03/27/24 by video and verified that I am speaking with the correct person using two identifiers. Mary Russo is currently located at work and no one is currently with her during the visit. The provider, Annabella CHRISTELLA Search, FNP is located in their office at time of visit.  I discussed the limitations, risks, security and privacy concerns of performing an evaluation and management service by video  and the availability of in person appointments. I also discussed with the patient that there may be a patient responsible charge related to this service. The patient expressed understanding and agreed to proceed.  CC: dysuria  History and Present Illness:  History of Present Illness   Mary Russo is a 18 year old female who presents with symptoms suggestive of a urinary tract infection.  Lower urinary tract symptoms - Increased frequency of urination for the past week - Dysuria present - urgency and hesitancy present - Progressive worsening of symptoms over time - No hematuria  Constitutional and associated symptoms - No fever, vomiting, flank pain  Barriers to evaluation - Currently at work and unable to provide a urine sample at the office today  Antibiotic allergies - No known allergies to antibiotics        ROS As per HPI.     Observations/Objective: Alert and oriented. Respirations unlabored. No cyanosis. Non toxic appearing. Normal mood and behavior.    Assessment and Plan: Mary Russo was seen today for dysuria.  Diagnoses and all orders for this visit:  Dysuria -     sulfamethoxazole -trimethoprim  (BACTRIM  DS) 800-160 MG tablet; Take 1 tablet by mouth 2 (two) times daily for 7 days.   Assessment and Plan    Dysuria Symptoms suggest UTI. Empirical antibiotic treatment started. Unable to provide specimen for testing.  - Prescribed Bactrim  BID for 7 days. - Advised to report if no improvement after  2-3 days.        Follow Up Instructions: Return to office for new or worsening symptoms, or if symptoms persist.      I discussed the assessment and treatment plan with the patient. The patient was provided an opportunity to ask questions and all were answered. The patient agreed with the plan and demonstrated an understanding of the instructions.   The patient was advised to call back or seek an in-person evaluation if the symptoms worsen or if the condition fails to improve as anticipated.  The above assessment and management plan was discussed with the patient. The patient verbalized understanding of and has agreed to the management plan. Patient is aware to call the clinic if symptoms persist or worsen. Patient is aware when to return to the clinic for a follow-up visit. Patient educated on when it is appropriate to go to the emergency department.    I provided 6 minutes of face-to-face time during this encounter.    Annabella CHRISTELLA Search, FNP

## 2024-04-22 ENCOUNTER — Ambulatory Visit: Payer: Self-pay

## 2024-04-22 NOTE — Telephone Encounter (Signed)
 FYI Only or Action Required?: Action required by provider: clinical question for provider.  Patient was last seen in primary care on 03/27/2024 by Joesph Annabella HERO, FNP.  Called Nurse Triage reporting Lab request.  Symptoms began today.  Interventions attempted: Nothing.  Symptoms are: unchanged.Pt. States oncology checks her Iron levels. Cannot see them until February. Asking if PCP will order labs. Having fatigue, elevated heart rate. Declined OV. Please advise pt.  Triage Disposition: Call PCP When Office is Open  Patient/caregiver understands and will follow disposition?: Yes     Copied from CRM #[id number]. Topic: Clinical - Red Word Triage >> Apr 22, 2024  3:38 PM Montie POUR wrote: Red Word that prompted transfer to Nurse Triage:  Mary Russo is very tired; high heart rate (130 resting); she is calling to get her iron checked. This has been going on for about 1 week. Reason for Disposition  Caller requesting routine or non-urgent lab result  Answer Assessment - Initial Assessment Questions 1. REASON FOR CALL or QUESTION: What is your reason for calling today? or How can I best     Could PCP check pt.'s iron level.Having symptoms - tired, elevated hear rate. Oncology cannot see her until February. 2. CALLER: Document the source of call. (e.g., laboratory staff, caregiver or patient).     Patient  Protocols used: PCP Call - No Triage-A-AH

## 2024-04-23 ENCOUNTER — Encounter: Payer: Self-pay | Admitting: Family Medicine

## 2024-04-23 NOTE — Telephone Encounter (Signed)
 Duplicate message addressing same issue. PCP is aware and waiting on his advise.

## 2024-04-23 NOTE — Telephone Encounter (Signed)
 It looks like she is scheduled to see you next Friday. Will this work? You dont have any openings before then.

## 2024-04-23 NOTE — Telephone Encounter (Signed)
 Okay sounds good

## 2024-04-23 NOTE — Telephone Encounter (Signed)
 Yes please make an appointment to see me for her ASAP and we can discuss the symptoms and we can check some levels of things.

## 2024-04-29 DIAGNOSIS — R509 Fever, unspecified: Secondary | ICD-10-CM | POA: Diagnosis not present

## 2024-04-29 DIAGNOSIS — R070 Pain in throat: Secondary | ICD-10-CM | POA: Diagnosis not present

## 2024-04-29 DIAGNOSIS — R051 Acute cough: Secondary | ICD-10-CM | POA: Diagnosis not present

## 2024-04-29 DIAGNOSIS — J069 Acute upper respiratory infection, unspecified: Secondary | ICD-10-CM | POA: Diagnosis not present

## 2024-05-02 ENCOUNTER — Ambulatory Visit: Admitting: Family Medicine

## 2024-05-07 ENCOUNTER — Ambulatory Visit: Admitting: Family Medicine

## 2024-05-07 VITALS — BP 117/76 | HR 96 | Ht 72.0 in

## 2024-05-07 DIAGNOSIS — D500 Iron deficiency anemia secondary to blood loss (chronic): Secondary | ICD-10-CM

## 2024-05-07 DIAGNOSIS — R5383 Other fatigue: Secondary | ICD-10-CM

## 2024-05-07 DIAGNOSIS — F325 Major depressive disorder, single episode, in full remission: Secondary | ICD-10-CM | POA: Diagnosis not present

## 2024-05-07 NOTE — Progress Notes (Signed)
 BP 117/76   Pulse 96   Ht 6' (1.829 m)   SpO2 99%   BMI 22.49 kg/m    Subjective:   Patient ID: Mary Russo, female    DOB: 04-Jun-2005, 18 y.o.   MRN: [id number]  HPI: Mary Russo is a 18 y.o. female presenting on 05/07/2024 for low iron   Discussed the use of AI scribe software for clinical note transcription with the patient, who gave verbal consent to proceed.  History of Present Illness   Mary Russo is a 18 year old female with severe iron deficiency anemia who presents with fatigue, bruising, and joint pain. She is accompanied by her young daughter.  Iron deficiency anemia and associated symptoms - Severe iron deficiency anemia, initially diagnosed with critically low iron level of 4 - Received two iron infusions with significant symptomatic improvement - Persistent fatigue despite treatment - Iron level decreased from 114 in May to 82 in July - D-dimer initially elevated, later normalized - Easy bruising, especially after scratching itchy areas - Joint pain ongoing - Currently taking B12 supplements and iron  Cardiac symptoms - Palpitations and sensation of heart being 'out of rhythm' when iron levels are low - Known history of premature ventricular contractions (PVCs) - Previously took diltiazem  for heart rate management, now discontinued  Dermatologic symptoms - Persistent acneiform lesions on chest and shoulders since August, recurring in same locations - Lesions do not itch - Severe pruritus on legs after bathing, accompanied by small bumps that resolve over time  Menstrual abnormalities - History of heavy menstrual bleeding with large clots - Diagnosed with abnormally thick uterine lining - Prescribed birth control patch Leland) for management, dissatisfied with current regimen - Previously experienced prolonged bleeding on oral contraceptive pills - History of pregnancy while on oral contraceptive pills  Thyroid  abnormalities - History of  thyroid  dysfunction with slightly elevated thyroid  levels in March, prior to anemia diagnosis  Anxiety and allergies - Currently taking citalopram  for anxiety, finds it helpful - Takes allergy medication, reports efficacy          Relevant past medical, surgical, family and social history reviewed and updated as indicated. Interim medical history since our last visit reviewed. Allergies and medications reviewed and updated.  Review of Systems  Constitutional:  Negative for chills and fever.  Eyes:  Negative for visual disturbance.  Respiratory:  Negative for chest tightness and shortness of breath.   Cardiovascular:  Negative for chest pain and leg swelling.  Genitourinary:  Positive for menstrual problem. Negative for difficulty urinating and dysuria.  Musculoskeletal:  Negative for back pain and gait problem.  Skin:  Negative for rash.  Neurological:  Negative for dizziness, light-headedness and headaches.  Psychiatric/Behavioral:  Negative for agitation and behavioral problems.   All other systems reviewed and are negative.   Per HPI unless specifically indicated above   Allergies as of 05/07/2024       Reactions   Peanut-containing Drug Products Rash   Peanuts, Peanut  butter   Fluarix [influenza Vac Split Quad] Other (See Comments)   Oral Lesions and swelling of the throat        Medication List        Accurate as of May 07, 2024  3:46 PM. If you have any questions, ask your nurse or doctor.          STOP taking these medications    diltiazem  120 MG tablet Commonly known as: Cardizem  Stopped by: [name]  Saryah Loper       TAKE these medications    cetirizine  10 MG tablet Commonly known as: ZyrTEC  Allergy Take 1 tablet (10 mg total) by mouth daily.   citalopram  20 MG tablet Commonly known as: CELEXA  Take 20 mg by mouth daily.   cyanocobalamin  1000 MCG tablet Commonly known as: VITAMIN B12 Take 1 tablet (1,000 mcg total) by mouth daily.    ferrous sulfate  325 (65 FE) MG EC tablet Take 1 tablet (325 mg total) by mouth every other day.   pantoprazole  40 MG tablet Commonly known as: PROTONIX  Take 1 tablet (40 mg total) by mouth daily.         Objective:   BP 117/76   Pulse 96   Ht 6' (1.829 m)   SpO2 99%   BMI 22.49 kg/m   Wt Readings from Last 3 Encounters:  12/18/23 165 lb 12.8 oz (75.2 kg)  10/05/23 165 lb 12.8 oz (75.2 kg)  09/07/23 163 lb 6.4 oz (74.1 kg)    Physical Exam Vitals and nursing note reviewed.  Constitutional:      Appearance: Normal appearance.  Neurological:     Mental Status: She is alert.    Physical Exam   CHEST: Lungs clear to auscultation bilaterally. CARDIOVASCULAR: Regular rate and rhythm, no abnormalities.         Assessment & Plan:   Problem List Items Addressed This Visit       Other   Anemia - Primary   Relevant Orders   Anemia Profile B   Thyroid  Panel With TSH   Depression, major, single episode, complete remission   Relevant Orders   Anemia Profile B   Thyroid  Panel With TSH   Other Visit Diagnoses       Other fatigue       Relevant Orders   Anemia Profile B   Thyroid  Panel With TSH          Woman's Wellness Visit Routine wellness visit with normal blood pressure and heart rate.  Iron deficiency anemia with heavy menstrual bleeding Severe iron deficiency anemia improved with iron infusions. Discussed IUD for menstrual control and anemia management. - Ordered blood work to recheck iron levels and immunopanel. - Ordered thyroid  function tests. - Discussed IUD placement for menstrual control and anemia management.  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  Acne vulgaris Persistent acne on chest and shoulders, possibly stress-related. Current topical treatment effective but symptoms recur. - Continue current topical acne treatment.  Atopic dermatitis Dry skin and itching on legs, exacerbated by dry  weather. - Continue using lotions for dry skin management.  Anxiety and depression Managed with citalopram , effective despite recent life stressors. - Continue citalopram  for anxiety and depression management.          Follow up plan: Return in about 6 months (around 11/05/2024), or if symptoms worsen or fail to improve, for Physical exam and recheck depression and anemia.  Counseling provided for all of the vaccine components Orders Placed This Encounter  Procedures   Anemia Profile B   Thyroid  Panel With TSH    [name] Levins, MD [facility] 05/07/2024, 3:46 PM

## 2024-05-08 LAB — ANEMIA PROFILE B
Basophils Absolute: 0.1 x10E3/uL (ref 0.0–0.2)
Basos: 1 %
EOS (ABSOLUTE): 0.3 x10E3/uL (ref 0.0–0.4)
Eos: 4 %
Ferritin: 84 ng/mL (ref 15–150)
Folate: 4.6 ng/mL (ref >3.0)
Hematocrit: 41.1 % (ref 34.0–46.6)
Hemoglobin: 13.4 g/dL (ref 11.1–15.9)
Immature Grans (Abs): 0 x10E3/uL (ref 0.0–0.1)
Immature Granulocytes: 0 %
Iron Saturation: 31 % (ref 15–55)
Iron: 92 ug/dL (ref 27–159)
Lymphocytes Absolute: 1.9 x10E3/uL (ref 0.7–3.1)
Lymphs: 22 %
MCH: 29.6 pg (ref 26.6–33.0)
MCHC: 32.6 g/dL (ref 31.5–35.7)
MCV: 91 fL (ref 79–97)
Monocytes Absolute: 0.5 x10E3/uL (ref 0.1–0.9)
Monocytes: 6 %
Neutrophils Absolute: 5.6 x10E3/uL (ref 1.4–7.0)
Neutrophils: 67 %
Platelets: 337 x10E3/uL (ref 150–450)
RBC: 4.52 x10E6/uL (ref 3.77–5.28)
RDW: 12 % (ref 11.7–15.4)
Retic Ct Pct: 1 % (ref 0.6–2.6)
Total Iron Binding Capacity: 294 ug/dL (ref 250–450)
UIBC: 202 ug/dL (ref 131–425)
Vitamin B-12: 350 pg/mL (ref 232–1245)
WBC: 8.4 x10E3/uL (ref 3.4–10.8)

## 2024-05-08 LAB — THYROID PANEL WITH TSH
Free Thyroxine Index: 2 (ref 1.2–4.9)
T3 Uptake Ratio: 27 % (ref 24–39)
T4, Total: 7.5 ug/dL (ref 4.5–12.0)
TSH: 3.04 u[IU]/mL (ref 0.450–4.500)

## 2024-05-12 ENCOUNTER — Ambulatory Visit: Payer: Self-pay | Admitting: Family Medicine

## 2024-05-26 ENCOUNTER — Encounter: Payer: Self-pay | Admitting: *Deleted

## 2024-06-02 ENCOUNTER — Other Ambulatory Visit: Payer: Self-pay | Admitting: *Deleted

## 2024-07-04 ENCOUNTER — Inpatient Hospital Stay

## 2024-07-11 ENCOUNTER — Inpatient Hospital Stay

## 2024-07-11 ENCOUNTER — Ambulatory Visit: Admitting: Oncology

## 2024-07-11 ENCOUNTER — Inpatient Hospital Stay: Admitting: Oncology

## 2024-08-14 ENCOUNTER — Inpatient Hospital Stay: Admitting: Oncology
